# Patient Record
Sex: Male | Born: 1980 | Race: Black or African American | Hispanic: No | Marital: Married | Smoking: Never smoker
Health system: Southern US, Community
[De-identification: ages and names within clinical notes are randomized; demographics above are authoritative.]

## PROBLEM LIST (undated history)

## (undated) DIAGNOSIS — I8390 Asymptomatic varicose veins of unspecified lower extremity: Secondary | ICD-10-CM

## (undated) HISTORY — DX: Asymptomatic varicose veins of unspecified lower extremity: I83.90

## (undated) HISTORY — PX: NO PAST SURGERIES: SHX2092

---

## 2018-03-09 DIAGNOSIS — Z0389 Encounter for observation for other suspected diseases and conditions ruled out: Secondary | ICD-10-CM | POA: Diagnosis not present

## 2018-03-09 DIAGNOSIS — Z1388 Encounter for screening for disorder due to exposure to contaminants: Secondary | ICD-10-CM | POA: Diagnosis not present

## 2018-03-09 DIAGNOSIS — Z3009 Encounter for other general counseling and advice on contraception: Secondary | ICD-10-CM | POA: Diagnosis not present

## 2018-03-21 NOTE — Congregational Nurse Program (Unsigned)
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 Recently arrived from refugee [place] and originally from [place]. Able to speak limited English, but feel the need for an interpreter for clarification in [place] or [place]. [place]ett[place] [facility] approximately two weeks ago with family of six children and spouse. Concerned about learning English to qualify for employment to support family. Recently received Medicaid coverage temporary. Requesting evaluation of health due to reoccurring abdominal pain on right side. Pain subsided today after increasing water in diet. Denies any specific illness in past. CBG 129 non/fasting. Blood pressure normal.Refer to [facility] for evaluation and entry exam. Follow up support through nurse office, agency social worker and staff at [facility].  Ferol LuzMarietta Quaneshia Wareing, RN/CN

## 2018-03-22 DIAGNOSIS — Z049 Encounter for examination and observation for unspecified reason: Secondary | ICD-10-CM | POA: Diagnosis not present

## 2018-03-23 DIAGNOSIS — Z0389 Encounter for observation for other suspected diseases and conditions ruled out: Secondary | ICD-10-CM | POA: Diagnosis not present

## 2018-03-23 DIAGNOSIS — Z111 Encounter for screening for respiratory tuberculosis: Secondary | ICD-10-CM | POA: Diagnosis not present

## 2018-03-23 DIAGNOSIS — Z23 Encounter for immunization: Secondary | ICD-10-CM | POA: Diagnosis not present

## 2018-03-23 DIAGNOSIS — Z1388 Encounter for screening for disorder due to exposure to contaminants: Secondary | ICD-10-CM | POA: Diagnosis not present

## 2018-03-23 DIAGNOSIS — Z049 Encounter for examination and observation for unspecified reason: Secondary | ICD-10-CM | POA: Diagnosis not present

## 2018-03-23 DIAGNOSIS — Z3009 Encounter for other general counseling and advice on contraception: Secondary | ICD-10-CM | POA: Diagnosis not present

## 2018-03-23 DIAGNOSIS — Z206 Contact with and (suspected) exposure to human immunodeficiency virus [HIV]: Secondary | ICD-10-CM | POA: Diagnosis not present

## 2018-04-17 NOTE — Congregational Nurse Program (Unsigned)
Initial visit for this [place] man seeking assistance arranging a PCP for family of spouse and four children. [place]rival in [place] through [employer] resettlement agency.  Appointment confirmed with [facility]. Advised to return with Medicaid information for family and names. Ferol Luz, RN/CN

## 2018-04-20 ENCOUNTER — Ambulatory Visit: Payer: Self-pay | Admitting: Family Medicine

## 2018-05-14 ENCOUNTER — Emergency Department (HOSPITAL_COMMUNITY): Payer: Medicaid Other

## 2018-05-14 ENCOUNTER — Emergency Department (HOSPITAL_COMMUNITY)
Admission: EM | Admit: 2018-05-14 | Discharge: 2018-05-14 | Disposition: A | Discharge status: Home / Self Care | Payer: Medicaid Other | Attending: Emergency Medicine | Admitting: Emergency Medicine

## 2018-05-14 ENCOUNTER — Encounter (HOSPITAL_COMMUNITY): Payer: Self-pay

## 2018-05-14 ENCOUNTER — Other Ambulatory Visit: Payer: Self-pay

## 2018-05-14 DIAGNOSIS — R51 Headache: Secondary | ICD-10-CM | POA: Diagnosis not present

## 2018-05-14 DIAGNOSIS — L97829 Non-pressure chronic ulcer of other part of left lower leg with unspecified severity: Secondary | ICD-10-CM | POA: Diagnosis not present

## 2018-05-14 DIAGNOSIS — I8392 Asymptomatic varicose veins of left lower extremity: Secondary | ICD-10-CM | POA: Diagnosis not present

## 2018-05-14 DIAGNOSIS — J111 Influenza due to unidentified influenza virus with other respiratory manifestations: Secondary | ICD-10-CM | POA: Diagnosis not present

## 2018-05-14 DIAGNOSIS — M79662 Pain in left lower leg: Secondary | ICD-10-CM | POA: Insufficient documentation

## 2018-05-14 DIAGNOSIS — R509 Fever, unspecified: Secondary | ICD-10-CM | POA: Diagnosis not present

## 2018-05-14 LAB — COMPREHENSIVE METABOLIC PANEL
ALT: 16 U/L (ref 0–44)
AST: 26 U/L (ref 15–41)
Albumin: 3.8 g/dL (ref 3.5–5.0)
Alkaline Phosphatase: 50 U/L (ref 38–126)
Anion gap: 8 (ref 5–15)
BILIRUBIN TOTAL: 0.5 mg/dL (ref 0.3–1.2)
BUN: 13 mg/dL (ref 6–20)
CALCIUM: 9 mg/dL (ref 8.9–10.3)
CHLORIDE: 104 mmol/L (ref 98–111)
CO2: 25 mmol/L (ref 22–32)
Creatinine, Ser: 0.92 mg/dL (ref 0.61–1.24)
Glucose, Bld: 102 mg/dL — ABNORMAL HIGH (ref 70–99)
Potassium: 3.5 mmol/L (ref 3.5–5.1)
Sodium: 137 mmol/L (ref 135–145)
TOTAL PROTEIN: 7.4 g/dL (ref 6.5–8.1)

## 2018-05-14 LAB — CBC WITH DIFFERENTIAL/PLATELET
Abs Immature Granulocytes: 0.02 10*3/uL (ref 0.00–0.07)
BASOS ABS: 0 10*3/uL (ref 0.0–0.1)
Basophils Relative: 0 %
EOS ABS: 0.5 10*3/uL (ref 0.0–0.5)
EOS PCT: 4 %
HCT: 43.3 % (ref 39.0–52.0)
Hemoglobin: 13.8 g/dL (ref 13.0–17.0)
Immature Granulocytes: 0 %
LYMPHS PCT: 18 %
Lymphs Abs: 1.9 10*3/uL (ref 0.7–4.0)
MCH: 28.4 pg (ref 26.0–34.0)
MCHC: 31.9 g/dL (ref 30.0–36.0)
MCV: 89.1 fL (ref 80.0–100.0)
MONO ABS: 1.5 10*3/uL — AB (ref 0.1–1.0)
Monocytes Relative: 14 %
NRBC: 0 % (ref 0.0–0.2)
Neutro Abs: 6.6 10*3/uL (ref 1.7–7.7)
Neutrophils Relative %: 64 %
Platelets: 161 10*3/uL (ref 150–400)
RBC: 4.86 MIL/uL (ref 4.22–5.81)
RDW: 12.5 % (ref 11.5–15.5)
WBC: 10.5 10*3/uL (ref 4.0–10.5)

## 2018-05-14 LAB — URINALYSIS, ROUTINE W REFLEX MICROSCOPIC
BILIRUBIN URINE: NEGATIVE
Glucose, UA: NEGATIVE mg/dL
Hgb urine dipstick: NEGATIVE
KETONES UR: NEGATIVE mg/dL
Leukocytes, UA: NEGATIVE
NITRITE: NEGATIVE
Protein, ur: NEGATIVE mg/dL
Specific Gravity, Urine: 1.013 (ref 1.005–1.030)
pH: 8 (ref 5.0–8.0)

## 2018-05-14 LAB — INFLUENZA PANEL BY PCR (TYPE A & B)
INFLBPCR: NEGATIVE
Influenza A By PCR: NEGATIVE

## 2018-05-14 LAB — I-STAT CG4 LACTIC ACID, ED: LACTIC ACID, VENOUS: 1.43 mmol/L (ref 0.5–1.9)

## 2018-05-14 MED ORDER — DIPHENHYDRAMINE HCL 50 MG/ML IJ SOLN
12.5000 mg | Freq: Once | INTRAMUSCULAR | Status: AC
  Administered 2018-05-14: 12.5 mg via INTRAVENOUS
  Filled 2018-05-14: qty 1

## 2018-05-14 MED ORDER — IBUPROFEN 600 MG PO TABS: 600.0000 mg | ORAL_TABLET | Freq: Three times a day (TID) | ORAL | 0 refills | Status: DC | PRN

## 2018-05-14 MED ORDER — SODIUM CHLORIDE 0.9 % IV BOLUS (SEPSIS): 250.0000 mL | Freq: Once | INTRAVENOUS | Status: DC

## 2018-05-14 MED ORDER — OSELTAMIVIR PHOSPHATE 75 MG PO CAPS
75.0000 mg | ORAL_CAPSULE | Freq: Once | ORAL | Status: AC
  Administered 2018-05-14: 75 mg via ORAL
  Filled 2018-05-14: qty 1

## 2018-05-14 MED ORDER — SODIUM CHLORIDE 0.9 % IV BOLUS (SEPSIS)
1000.0000 mL | Freq: Once | INTRAVENOUS | Status: AC
  Administered 2018-05-14 (×2): 1000 mL via INTRAVENOUS

## 2018-05-14 MED ORDER — SODIUM CHLORIDE 0.9 % IV BOLUS (SEPSIS)
1000.0000 mL | Freq: Once | INTRAVENOUS | Status: AC
  Administered 2018-05-14: 1000 mL via INTRAVENOUS

## 2018-05-14 MED ORDER — OSELTAMIVIR PHOSPHATE 75 MG PO CAPS: 75.0000 mg | ORAL_CAPSULE | Freq: Two times a day (BID) | ORAL | 0 refills | Status: DC

## 2018-05-14 MED ORDER — METOCLOPRAMIDE HCL 5 MG/ML IJ SOLN
10.0000 mg | Freq: Once | INTRAMUSCULAR | Status: AC
  Administered 2018-05-14: 10 mg via INTRAVENOUS
  Filled 2018-05-14: qty 2

## 2018-05-14 MED ORDER — ACETAMINOPHEN 325 MG PO TABS
650.0000 mg | ORAL_TABLET | Freq: Once | ORAL | Status: AC
  Administered 2018-05-14: 650 mg via ORAL
  Filled 2018-05-14: qty 2

## 2018-05-14 MED ORDER — IBUPROFEN 800 MG PO TABS
800.0000 mg | ORAL_TABLET | Freq: Once | ORAL | Status: AC
  Administered 2018-05-14: 800 mg via ORAL
  Filled 2018-05-14: qty 1

## 2018-05-14 NOTE — ED Provider Notes (Signed)
[facility] EMERGENCY DEPARTMENT Provider Note   CSN: [id number] Arrival date & time: 05/14/18  2035     History   Chief Complaint Chief Complaint  Patient presents with  . Migraine    HPI William Carroll is a 37 y.o. male otherwise healthy here presenting with fever, chills, headaches, weakness, eye pain.  Patient was a refugee from [place] and came here about 3 months ago.  He has not traveled back since then and has no sick contacts.  Patient states that since yesterday he has been having posterior headaches and associated with some eye pain.  He states that he his eyes tear up a lot but denies any purulent discharge or photophobia or phonophobia.  He states that he has some chills but did not know that he had a fever.  He denies any cough or vomiting or abdominal pain or diarrhea or urinary symptoms.  Patient also mentions to me that he has intermittent swelling of his left lower leg for the last several years.  Apparently several years ago there was some drainage from it and he was thought to have varicose veins.  Denies any recent travel history of blood clots.  Patient denies any sick contacts does have 6 kids at home. In particular, denies any exposure to Ebola.   The history is provided by the patient.  [place] interpreter used   History reviewed. No pertinent past medical history.  There are no active problems to display for this patient.   History reviewed. No pertinent surgical history.      Home Medications    Prior to Admission medications   Not on File    Family History No family history on file.  Social History Social History   Tobacco Use  . Smoking status: Never Smoker  . Smokeless tobacco: Never Used  Substance Use Topics  . Alcohol use: Not Currently  . Drug use: Never     Allergies   Patient has no known allergies.   Review of Systems Review of Systems  Constitutional: Positive for fever.  Neurological: Positive for  headaches.  All other systems reviewed and are negative.    Physical Exam Updated Vital Signs BP 123/75   Pulse 84   Temp (!) 102.7 F (39.3 C) (Oral)   Resp 20   Wt 72 kg   SpO2 98%   BMI 24.62 kg/m   Physical Exam  Constitutional: He is oriented to person, place, and time. He appears well-developed and well-nourished.  HENT:  Head: Normocephalic.  Right Ear: External ear normal.  Left Ear: External ear normal.  Mouth/Throat: Oropharynx is clear and moist.  Eyes: Pupils are equal, round, and reactive to light. Conjunctivae and EOM are normal.  Neck: Normal range of motion. Neck supple.  No meningeal signs, no midline tenderness   Cardiovascular: Normal rate, regular rhythm and normal heart sounds.  Pulmonary/Chest: Effort normal and breath sounds normal. No stridor. No respiratory distress.  Abdominal: Soft. Bowel sounds are normal. He exhibits no distension. There is no tenderness.  Musculoskeletal: Normal range of motion.  L calf with possible varicose veins, no calf tenderness. On the lateral aspect, there is a small cyst with no obvious abscess or cellulitis (see pictures below)  Neurological: He is alert and oriented to person, place, and time. No cranial nerve deficit. Coordination normal.  CN 2- 12 intact, nl strength throughout. Nl gait   Skin: Skin is warm.  Nursing note and vitals reviewed.  ED Treatments / Results  Labs (all labs ordered are listed, but only abnormal results are displayed) Labs Reviewed  COMPREHENSIVE METABOLIC PANEL - Abnormal; Notable for the following components:      Result Value   Glucose, Bld 102 (*)    All other components within normal limits  CBC WITH DIFFERENTIAL/PLATELET - Abnormal; Notable for the following components:   Monocytes Absolute 1.5 (*)    All other components within normal limits  URINALYSIS, ROUTINE W REFLEX MICROSCOPIC - Abnormal; Notable for the following components:   Color, Urine STRAW (*)    All  other components within normal limits  CULTURE, BLOOD (ROUTINE X 2)  CULTURE, BLOOD (ROUTINE X 2)  URINE CULTURE  INFLUENZA PANEL BY PCR (TYPE A & B)  I-STAT CG4 LACTIC ACID, ED    EKG EKG Interpretation  Date/Time:  Sunday May 14 2018 21:16:02 EST Ventricular Rate:  95 PR Interval:    QRS Duration: 73 QT Interval:  309 QTC Calculation: 389 R Axis:   83 Text Interpretation:  Sinus rhythm Consider left ventricular hypertrophy No previous ECGs available Confirmed by Richardean Canal [phone]) on 05/14/2018 9:41:34 PM   Radiology Dg Chest 2 View  Result Date: 05/14/2018 CLINICAL DATA:  Headache, generalized body aches today, fever. EXAM: CHEST - 2 VIEW COMPARISON:  None. FINDINGS: Cardiomediastinal silhouette is within normal limits in size and configuration. Lungs are clear. Lung volumes are normal. No evidence of pneumonia. No pleural effusion. No pneumothorax seen. Osseous structures about the chest are unremarkable. IMPRESSION: No active cardiopulmonary disease. No evidence of pneumonia. Electronically Signed   By: Bary Richard M.D.   On: 05/14/2018 22:07   Dg Tibia/fibula Left  Result Date: 05/14/2018 CLINICAL DATA:  LEFT lower leg pain, lower extremity ulcers. EXAM: LEFT TIBIA AND FIBULA - 2 VIEW COMPARISON:  None. FINDINGS: Osseous alignment is normal. Bone mineralization is normal. No acute or suspicious osseous lesion. Prominent varicosities within the superficial soft tissues of the calf. No evidence of soft tissue gas or fluid collection. IMPRESSION: 1. No osseous abnormality. 2. Prominent varicosities within the superficial soft tissues of the calf. No soft tissue gas seen. Electronically Signed   By: Bary Richard M.D.   On: 05/14/2018 22:09   Ct Head Wo Contrast  Result Date: 05/14/2018 CLINICAL DATA:  Acute onset of headache and eye pain. EXAM: CT HEAD WITHOUT CONTRAST TECHNIQUE: Contiguous axial images were obtained from the base of the skull through the vertex without  intravenous contrast. COMPARISON:  None. FINDINGS: Brain: No evidence of acute infarction, hemorrhage, hydrocephalus, extra-axial collection or mass lesion/mass effect. The posterior fossa, including the cerebellum, brainstem and fourth ventricle, is within normal limits. The third and lateral ventricles, and basal ganglia are unremarkable in appearance. The cerebral hemispheres are symmetric in appearance, with normal gray-white differentiation. No mass effect or midline shift is seen. Vascular: No hyperdense vessel or unexpected calcification. Skull: No free fluid is identified within the pelvis. The colon is unremarkable in appearance; the appendix is normal in caliber, and contains air. Sinuses/Orbits: The visualized portions of the orbits are within normal limits. There is opacification of the diminutive right maxillary sinus. The remaining paranasal sinuses and mastoid air cells are well-aerated. Other: No significant soft tissue abnormalities are seen. IMPRESSION: 1. No acute intracranial pathology seen on CT. 2. Opacification of the diminutive right maxillary sinus. Electronically Signed   By: Roanna Raider M.D.   On: 05/14/2018 21:45    Procedures Procedures (including critical care time)  Medications Ordered in ED Medications  sodium chloride 0.9 % bolus 1,000 mL (1,000 mLs Intravenous New Bag/Given 05/14/18 2223)    And  sodium chloride 0.9 % bolus 1,000 mL (0 mLs Intravenous Stopped 05/14/18 2220)    And  sodium chloride 0.9 % bolus 250 mL (has no administration in time range)  acetaminophen (TYLENOL) tablet 650 mg (650 mg Oral Given 05/14/18 2208)  metoCLOPramide (REGLAN) injection 10 mg (10 mg Intravenous Given 05/14/18 2208)  diphenhydrAMINE (BENADRYL) injection 12.5 mg (12.5 mg Intravenous Given 05/14/18 2208)  oseltamivir (TAMIFLU) capsule 75 mg (75 mg Oral Given 05/14/18 2231)  ibuprofen (ADVIL,MOTRIN) tablet 800 mg (800 mg Oral Given 05/14/18 2247)     Initial Impression /  Assessment and Plan / ED Course  I have reviewed the triage vital signs and the nursing notes.  Pertinent labs & imaging results that were available during my care of the patient were reviewed by me and considered in my medical decision making (see chart for details).    William Carroll is a 37 y.o. male here with headache, fever. He is well appearing, no meningeal signs. Came to the [place] from [place] 3 months ago and had no recent travel or Ebola exposure or sick contacts. Normal neuro exam. I think likely flu syndrome vs viral syndrome with headaches. Consider pneumonia vs UTI as well. Will get labs, CT head, flu, UA, CXR. Will hydrate and reassess.   10:56 PM WBC nl. Lactate nl. UA and CXR nl. CT head unremarkable. Temp down to 101 after tylenol. Headache improved with migraine cocktail. I think likely flu syndrome. Flu sent and his symptoms are less than 24 hrs so given a dose of tamiflu here and will dc home with tamiflu. Gave strict return precautions. Of note, he has varicose veins on the left leg that can be followed up outpatient.    Final Clinical Impressions(s) / ED Diagnoses   Final diagnoses:  None    ED Discharge Orders    None       Charlynne Pander, MD 05/14/18 2257

## 2018-05-14 NOTE — Discharge Instructions (Addendum)
Take motrin 600 mg every 6 hrs for fever.   Take tamiflu as prescribed. You will be called for positive flu results.   See your doctor.   You have varicose veins and your doctor can refer you to a specialist   Return to ER if you have fever > 101 for 5 days, worse headaches, vomiting, trouble breathing.

## 2018-05-14 NOTE — ED Triage Notes (Signed)
Used  [place] interpreter,Per pt has headache and pain in eyes. Headache gone on since last night. Denies N&V.

## 2018-05-16 LAB — URINE CULTURE: Culture: NO GROWTH

## 2018-05-19 LAB — CULTURE, BLOOD (ROUTINE X 2)
CULTURE: NO GROWTH
Culture: NO GROWTH
Special Requests: ADEQUATE
Special Requests: ADEQUATE

## 2018-05-24 ENCOUNTER — Encounter: Payer: Self-pay | Admitting: Family Medicine

## 2018-05-24 ENCOUNTER — Ambulatory Visit: Payer: Medicaid Other | Attending: Family Medicine | Admitting: Family Medicine

## 2018-05-24 VITALS — BP 116/77 | HR 74 | Temp 98.3°F | Resp 16 | Ht 69.5 in | Wt 165.6 lb

## 2018-05-24 DIAGNOSIS — Z09 Encounter for follow-up examination after completed treatment for conditions other than malignant neoplasm: Secondary | ICD-10-CM | POA: Diagnosis not present

## 2018-05-24 DIAGNOSIS — M7989 Other specified soft tissue disorders: Secondary | ICD-10-CM | POA: Diagnosis present

## 2018-05-24 DIAGNOSIS — I83812 Varicose veins of left lower extremities with pain: Secondary | ICD-10-CM | POA: Diagnosis not present

## 2018-05-24 DIAGNOSIS — I8392 Asymptomatic varicose veins of left lower extremity: Secondary | ICD-10-CM | POA: Insufficient documentation

## 2018-05-24 NOTE — Progress Notes (Signed)
Subjective:    Patient ID: William Carroll, male    DOB: Dec 18, 1980, 37 y.o.   MRN: [id number]   Due to a language barrier, patient was accompanied by live interpreter at today's visit.  Patient prefers communication and [place] or [place]ili but does speak some [place].  HPI       37 year old male new to the practice.  Patient was seen in the emergency department on 05/14/2018 secondary to complaint of fever, headaches, weakness and eye pain.  ED believes that patient with possible flu syndrome and patient was given dose of Tamiflu and prescription for Tamiflu.  Patient was also noted to have varicose veins which may require outpatient follow-up.  On review of labs from the ED, patient was negative for influenza test and blood cultures were negative.  Urine culture was done which showed no growth.      At today's visit, patient reports that he feels much better.  Patient has no further headache, sore throat or fever.  Patient feels back to his baseline.  Patient states that his only problem continues to be pain and swelling in an area on the posterior left calf.  Patient denies any prior injury to the left lower leg.  Patient states that the pain is a dull, aching sensation that ranges between a 6 and an 8 on a 0-to-10 scale.  Patient occasionally takes over-the-counter pain medication.  Patient does spend a long time standing at his job.      Patient reports no known drug allergies.  Patient denies any significant past medical history.  Patient is currently on no prescription medications.  Patient is married and does not drink or smoke.  Patient has had no past surgeries.  Patient reports family history is negative for hypertension, diabetes, cancer, heart attacks and strokes.  Patient reports that there have been no significant illnesses in his family.        Review of Systems  Constitutional: Negative for chills, diaphoresis, fatigue and fever.  HENT: Negative for congestion, sore throat and  trouble swallowing.   Respiratory: Negative for cough and shortness of breath.   Cardiovascular: Negative for chest pain, palpitations and leg swelling.       Patient has some recurrent swelling of varicose veins in the back of the left calf  Gastrointestinal: Negative for abdominal pain and nausea.  Endocrine: Negative for polydipsia, polyphagia and polyuria.  Genitourinary: Negative for dysuria and frequency.  Musculoskeletal: Negative for arthralgias, back pain, gait problem and joint swelling.  Neurological: Negative for dizziness and headaches.       Objective:   Physical Exam BP 116/77   Pulse 74   Temp 98.3 F (36.8 C) (Oral)   Resp 16   Ht 5' 9.5" (1.765 m)   Wt 165 lb 9.6 oz (75.1 kg)   SpO2 97%   BMI 24.10 kg/m Nurse's notes and vital signs reviewed General- well-nourished well-developed adult male in no acute distress.  Patient is accompanied by an interpreter at today's visit. ENT- TMs normal, patient with mild edema of the nasal turbinates, no nasal discharge, patient with mild posterior pharynx erythema. Neck-supple, no lymphadenopathy, no thyromegaly, no carotid bruit Lungs-clear to auscultation bilaterally Cardiovascular-regular rate and rhythm Abdomen-soft, nontender Back-no CVA tenderness Extremities- no edema with exception of some swollen and slightly tender varicose veins on the back of the left upper calf        Assessment & Plan:  1. Varicose veins of left lower extremity with pain Patient with  complaint of long-standing issues with discomfort and swelling in the left lower extremity due to a collection of varicose veins.  Patient will be referred to a vein and vascular specialist for further evaluation and treatment. -Ambulatory referral to vascular surgery  2.  Emergency department follow-up encounter Patient's notes and labs from his recent emergency department visit were reviewed prior to patient's appointment and results as well as labs were  reviewed with the patient.  Patient denies any current symptoms.  Patient reports that his fever and headache have both resolved and he feels as if he is back to his baseline state of health.  *Patient was offered but declines influenza immunization at today's visit  An After Visit Summary was printed and given to the patient.  Return if symptoms worsen or fail to improve, for follow-up as needed.

## 2018-07-14 ENCOUNTER — Other Ambulatory Visit: Payer: Self-pay | Admitting: Vascular Surgery

## 2018-07-14 DIAGNOSIS — I83812 Varicose veins of left lower extremities with pain: Secondary | ICD-10-CM

## 2018-07-31 DIAGNOSIS — Z23 Encounter for immunization: Secondary | ICD-10-CM | POA: Diagnosis not present

## 2018-08-16 ENCOUNTER — Encounter: Payer: Medicaid Other | Admitting: Vascular Surgery

## 2018-08-16 ENCOUNTER — Encounter (HOSPITAL_COMMUNITY): Payer: Medicaid Other

## 2018-08-24 ENCOUNTER — Inpatient Hospital Stay (HOSPITAL_COMMUNITY): Admission: RE | Admit: 2018-08-24 | Payer: Medicaid Other | Source: Ambulatory Visit

## 2018-08-24 ENCOUNTER — Encounter: Payer: Medicaid Other | Admitting: Vascular Surgery

## 2018-08-24 ENCOUNTER — Encounter: Payer: Self-pay | Admitting: Family

## 2018-10-19 ENCOUNTER — Encounter (HOSPITAL_COMMUNITY): Payer: Medicaid Other

## 2018-10-19 ENCOUNTER — Encounter: Payer: Medicaid Other | Admitting: Vascular Surgery

## 2018-11-17 ENCOUNTER — Ambulatory Visit (HOSPITAL_COMMUNITY)
Admission: RE | Admit: 2018-11-17 | Discharge: 2018-11-17 | Disposition: A | Discharge status: Home / Self Care | Payer: Medicaid Other | Source: Ambulatory Visit | Attending: Vascular Surgery | Admitting: Vascular Surgery

## 2018-11-17 ENCOUNTER — Other Ambulatory Visit: Payer: Self-pay

## 2018-11-17 DIAGNOSIS — I83812 Varicose veins of left lower extremities with pain: Secondary | ICD-10-CM | POA: Insufficient documentation

## 2018-11-20 ENCOUNTER — Ambulatory Visit (INDEPENDENT_AMBULATORY_CARE_PROVIDER_SITE_OTHER): Payer: Medicaid Other | Admitting: Surgery

## 2018-11-20 ENCOUNTER — Other Ambulatory Visit: Payer: Self-pay

## 2018-11-20 ENCOUNTER — Encounter: Payer: Self-pay | Admitting: Surgery

## 2018-11-20 VITALS — BP 127/83 | HR 75 | Temp 98.0°F | Resp 20 | Ht 69.5 in | Wt 162.0 lb

## 2018-11-20 DIAGNOSIS — I83812 Varicose veins of left lower extremities with pain: Secondary | ICD-10-CM | POA: Diagnosis not present

## 2018-11-20 NOTE — Progress Notes (Signed)
[facility]  Patient name: William Carroll MRN: [id number] DOB: 11/23/1980 Sex: male   REQUESTING PROVIDER:    "family doctor"    REASON FOR CONSULT:    Leg pain and swelling  HISTORY OF PRESENT ILLNESS:   William Carroll is a 38 y.o. male, who has recently moved to the [place].  He speaks Swah[place].  Language interpretation provided by [facility] interpreter.  The patient tells me that he has had leg pain for a long time.  He denies any history of DVT.  He does have some leg swelling.  He has not worn compression socks.  He describes the pain is constant and  PAST MEDICAL HISTORY    Past Medical History:  Diagnosis Date  . Varicose veins of lower extremity      FAMILY HISTORY   Family History  Problem Relation Age of Onset  . Diabetes Neg Hx   . Hypertension Neg Hx   . CAD Neg Hx   . CVA Neg Hx   . Cancer Neg Hx     SOCIAL HISTORY:   Social History   Socioeconomic History  . Marital status: Married    Spouse name: Not on file  . Number of children: Not on file  . Years of education: Not on file  . Highest education level: Not on file  Occupational History  . Not on file  Social Needs  . Financial resource strain: Not on file  . Food insecurity:    Worry: Not on file    Inability: Not on file  . Transportation needs:    Medical: Not on file    Non-medical: Not on file  Tobacco Use  . Smoking status: Never Smoker  . Smokeless tobacco: Never Used  Substance and Sexual Activity  . Alcohol use: Not Currently  . Drug use: Never  . Sexual activity: Yes  Lifestyle  . Physical activity:    Days per week: Not on file    Minutes per session: Not on file  . Stress: Not on file  Relationships  . Social connections:    Talks on phone: Not on file    Gets together: Not on file    Attends religious service: Not on file    Active member of club or organization: Not on file    Attends  meetings of clubs or organizations: Not on file    Relationship status: Not on file  . Intimate partner violence:    Fear of current or ex partner: Not on file    Emotionally abused: Not on file    Physically abused: Not on file    Forced sexual activity: Not on file  Other Topics Concern  . Not on file  Social History Narrative  . Not on file    ALLERGIES:    No Known Allergies  CURRENT MEDICATIONS:    No current outpatient medications on file.   No current facility-administered medications for this visit.     REVIEW OF SYSTEMS:   [X]  denotes positive finding, [ ]  denotes negative finding Cardiac  Comments:  Chest pain or chest pressure:    Shortness of breath upon exertion:    Short of breath when lying flat:    Irregular heart rhythm:        Vascular    Pain in calf, thigh, or hip brought on by ambulation:    Pain in feet at night that wakes you up from your sleep:  Blood clot in your veins:    Leg swelling:  x       Pulmonary    Oxygen at home:    Productive cough:     Wheezing:         Neurologic    Sudden weakness in arms or legs:     Sudden numbness in arms or legs:     Sudden onset of difficulty speaking or slurred speech:    Temporary loss of vision in one eye:     Problems with dizziness:         Gastrointestinal    Blood in stool:      Vomited blood:         Genitourinary    Burning when urinating:     Blood in urine:        Psychiatric    Major depression:         Hematologic    Bleeding problems:    Problems with blood clotting too easily:        Skin    Rashes or ulcers:        Constitutional    Fever or chills:     PHYSICAL EXAM:   Vitals:   11/20/18 1029  BP: 127/83  Pulse: 75  Resp: 20  Temp: 98 F (36.7 C)  SpO2: 93%  Weight: 73.5 kg  Height: 5' 9.5" (1.765 m)    GENERAL: The patient is a well-nourished male, in no acute distress. The vital signs are documented above. CARDIAC: There is a regular rate and  rhythm.  VASCULAR: large prominent varicosity on the left posterior calf.  Trace edema PULMONARY: Nonlabored respirations ABDOMEN: Soft and non-tender with normal pitched bowel sounds.  MUSCULOSKELETAL: There are no major deformities or cyanosis. NEUROLOGIC: No focal weakness or paresthesias are detected. SKIN: There are no ulcers or rashes noted. PSYCHIATRIC: The patient has a normal affect.  STUDIES:   I have reviewed the following duplex: Left: Abnormal reflux times were noted in the common femoral vein, popliteal vein, origin of the small saphenous vein, prox small saphenous vein, and mid small saphenous vein. There is no evidence of deep vein thrombosis in the lower extremity.There is  no evidence of superficial venous thrombosis. VEIN DIAMETERS:               Right (cm)Left (cm) +------------------------------+----------+---------+ GSV at Saphenofemoral junction          0.738     +------------------------------+----------+---------+ GSV at prox thigh                       0.392     +------------------------------+----------+---------+ GSV at mid thigh                        0.374     +------------------------------+----------+---------+ GSV at distal thigh                     0.366     +------------------------------+----------+---------+ GSV at knee                             0.301     +------------------------------+----------+---------+ GSV prox calf                           0.269     +------------------------------+----------+---------+ SSV origin  0.872     +------------------------------+----------+---------+ SSV prox                                0.758     +------------------------------+----------+---------+ SSV mid                                 0.72      +------------------------------+----------+---------+  ASSESSMENT and PLAN   Painful varicosity with edema to the left calf (CEAP class 3):   I discussed with the patient that the first step in treating this is to keep his leg elevated.  I provided him with a 20-30 compression stocking which I told him to wear.  He will follow up in 3 months for repeat evaluation.  He may be a candidate for stab phlebecotmy and laser ablation of the SSV   Durene Cal, IV, MD, FACS [facility] [phone] Pager [phone]

## 2019-02-21 ENCOUNTER — Encounter: Payer: Self-pay | Admitting: Vascular Surgery

## 2019-02-21 ENCOUNTER — Other Ambulatory Visit: Payer: Self-pay

## 2019-02-21 ENCOUNTER — Ambulatory Visit (INDEPENDENT_AMBULATORY_CARE_PROVIDER_SITE_OTHER): Payer: Medicaid Other | Admitting: Vascular Surgery

## 2019-02-21 VITALS — BP 114/78 | HR 76 | Temp 97.5°F | Resp 16 | Ht 68.5 in | Wt 162.2 lb

## 2019-02-21 DIAGNOSIS — I83812 Varicose veins of left lower extremities with pain: Secondary | ICD-10-CM

## 2019-02-21 NOTE — Progress Notes (Signed)
Patient is a 38 year old male previously seen by my partner Dr. Trula Slade in May 2020.  At that time he was complaining of left leg pain that has been going on for several years.  He had no prior history of DVT.  He does occasionally have swelling in the left leg.  He was given a prescription for compression stockings at his last office visit.  Review of his duplex ultrasound from his previous office visit shows a dilated lesser saphenous vein of 7 to 8 mm with diffuse reflux.  No significant reflux in the greater saphenous with a vein diameter of about 3 to 3-1/2 mm.  Review today was conducted through a [employer] provided interpreter.  He has been compliant wearing his compression stockings.  He states that the veins do go away when he wears the compression stockings but then come back.  He has had only mild symptomatic relief and still complains of pain and swelling.  Review of systems: He has no shortness of breath.  He has no chest pain.  Physical exam:  Vitals:   02/21/19 1335  BP: 114/78  Pulse: 76  Resp: 16  Temp: (!) 97.5 F (36.4 C)  TempSrc: Temporal  SpO2: 99%  Weight: 162 lb 3.2 oz (73.6 kg)  Height: 5' 8.5" (1.74 m)    Left lower extremity, large cluster of varicosities in the posterior calf 7 to 8 mm diameter.  Data: I reviewed the patient's recent ultrasound which showed a 7 to 8 mm lesser saphenous vein with diffuse reflux.  He had a normal greater saphenous vein.  I repeated portions of his ultrasound today with the SonoSite which again confirmed the above findings.  Assessment: Symptomatic varicose veins with pain and swelling left leg.  Plan: Stab avulsions with laser ablation of the left greater saphenous vein in the near future pending insurance approval.  Ruta Hinds, MD [facility] [place]: [phone] Pager: [phone]

## 2019-02-23 ENCOUNTER — Other Ambulatory Visit: Payer: Self-pay | Admitting: *Deleted

## 2019-02-23 DIAGNOSIS — I83812 Varicose veins of left lower extremities with pain: Secondary | ICD-10-CM

## 2019-03-14 ENCOUNTER — Other Ambulatory Visit: Payer: Self-pay

## 2019-03-14 ENCOUNTER — Ambulatory Visit: Payer: Medicaid Other | Admitting: Vascular Surgery

## 2019-03-14 ENCOUNTER — Encounter: Payer: Self-pay | Admitting: Vascular Surgery

## 2019-03-14 VITALS — BP 127/82 | HR 74 | Temp 98.0°F | Resp 16 | Ht 68.5 in | Wt 162.0 lb

## 2019-03-14 DIAGNOSIS — I83812 Varicose veins of left lower extremities with pain: Secondary | ICD-10-CM

## 2019-03-14 HISTORY — PX: ENDOVENOUS ABLATION SAPHENOUS VEIN W/ LASER: SUR449

## 2019-03-14 NOTE — Progress Notes (Signed)
     Laser Ablation Procedure    Date: 03/14/2019   William Carroll DOB:1981/04/04  Consent signed: Yes    Surgeon: Ruta Hinds MD   Procedure: Laser Ablation: left Small Saphenous Vein  BP 127/82 (BP Location: Right Arm, Patient Position: Sitting, Cuff Size: Normal)   Pulse 74   Temp 98 F (36.7 C) (Temporal)   Resp 16   Ht 5' 8.5" (1.74 m)   Wt 162 lb (73.5 kg)   SpO2 98%   BMI 24.27 kg/m   Tumescent Anesthesia: 300 cc 0.9% NaCl with 50 cc Lidocaine HCL 1%  and 15 cc 8.4% NaHCO3  Local Anesthesia: 4 cc Lidocaine HCL and NaHCO3 (ratio 2:1)  15 watts continuous mode        Total energy: 756 Joules   Total time: 0:50  Laser Fiber Ref. # [id number]      Lot # [id number]   Stab Phlebectomy: 10-20 Sites: Calf and Ankle  Patient tolerated procedure well  Notes: Patient wore face mask.  All staff members wore facial masks and facial shields/goggles.  Interpreter present in room before, during, and after procedure to translate.    Description of Procedure:  After marking the course of the secondary varicosities, the patient was placed on the operating table in the prone position, and the left leg was prepped and draped in sterile fashion.   Local anesthetic was administered and under ultrasound guidance the saphenous vein was accessed with a micro needle and guide wire; then the mirco puncture sheath was placed.  A guide wire was inserted saphenopopliteal junction , followed by a 5 french sheath.  The position of the sheath and then the laser fiber below the junction was confirmed using the ultrasound.  Tumescent anesthesia was administered along the course of the saphenous vein using ultrasound guidance. The patient was placed in Trendelenburg position and protective laser glasses were placed on patient and staff, and the laser was fired at 15 watts continuous mode advancing 1-29mm/second for a total of 756 joules.   For stab phlebectomies, local anesthetic was administered at the  previously marked varicosities, and tumescent anesthesia was administered around the vessels.  Ten to 20 stab wounds were made using the tip of an 11 blade. And using the vein hook, the phlebectomies were performed using a hemostat to avulse the varicosities.  Adequate hemostasis was achieved.     Steri strips were applied to the stab wounds and ABD pads and thigh high compression stockings were applied.  Ace wrap bandages were applied over the phlebectomy sites and at the top of the saphenopopliteal junction. Blood loss was less than 15 cc.  Discharge instructions reviewed with patient and hardcopy of discharge instructions given to patient to take home. The patient ambulated out of the operating room having tolerated the procedure well.  Ruta Hinds, MD [facility] Office: [phone] Pager: [phone]

## 2019-03-15 ENCOUNTER — Telehealth: Payer: Self-pay | Admitting: *Deleted

## 2019-03-15 NOTE — Telephone Encounter (Signed)
Through Swahili interpreter, informed Mr. William Carroll that I had completed and FAXED the attending physician statement to [place] as he requested. Informed him that there was a section that he would need to complete and a section that his employer would need to complete that was part of the short term disability packet.  I informed him that he could pick up the original short term disability paperwork in a sealed envelope at the [facility] front desk Monday-Friday between 9 AM-5 PM.

## 2019-03-21 ENCOUNTER — Ambulatory Visit (INDEPENDENT_AMBULATORY_CARE_PROVIDER_SITE_OTHER): Payer: Self-pay | Admitting: Vascular Surgery

## 2019-03-21 ENCOUNTER — Ambulatory Visit (HOSPITAL_COMMUNITY)
Admission: RE | Admit: 2019-03-21 | Discharge: 2019-03-21 | Disposition: A | Discharge status: Home / Self Care | Payer: Medicaid Other | Source: Ambulatory Visit | Attending: Vascular Surgery | Admitting: Vascular Surgery

## 2019-03-21 ENCOUNTER — Other Ambulatory Visit: Payer: Self-pay

## 2019-03-21 ENCOUNTER — Encounter: Payer: Self-pay | Admitting: Vascular Surgery

## 2019-03-21 VITALS — BP 123/80 | HR 72 | Temp 97.1°F | Resp 16 | Ht 68.5 in | Wt 162.0 lb

## 2019-03-21 DIAGNOSIS — I83812 Varicose veins of left lower extremities with pain: Secondary | ICD-10-CM | POA: Insufficient documentation

## 2019-03-21 NOTE — Progress Notes (Signed)
Patient is a 38 year old male who returns for postoperative follow-up today.  He recently underwent laser ablation with stab avulsions of his left lesser saphenous vein.  He reports still some mild soreness.  He has no shortness of breath.  He has no chest pain.  He had no bleeding episodes.  Patient does not speak [place] and a Zacarias Pontes provided interpreter was used for the office visit today.  Review of systems: He has no fever.  He has no cough.  Physical exam:  Vitals:   03/21/19 1047  BP: 123/80  Pulse: 72  Resp: 16  Temp: (!) 97.1 F (36.2 C)  TempSrc: Temporal  SpO2: 99%  Weight: 162 lb (73.5 kg)  Height: 5' 8.5" (1.74 m)    Healing stab avulsion incisions left leg significant improvement of prominent varicosities with possibly only one area of varicose vein left in the posterior calf.  Data: Duplex ultrasound today shows successful ablation of the lesser saphenous vein with no evidence of DVT.  I reviewed and interpreted the study.  Assessment: Doing well status post laser ablation lesser saphenous and stab avulsions.  Plan: Patient will continue to wear his compression stockings for symptomatic relief.  He will call us in the future if he has any recurrence of symptoms.  Otherwise he will follow-up on an as-needed basis.  He will return to work on September 28.  Ruta Hinds, MD [facility] Office: [phone] Pager: [phone]

## 2019-07-25 ENCOUNTER — Encounter: Payer: Self-pay | Admitting: Vascular Surgery

## 2019-07-25 ENCOUNTER — Other Ambulatory Visit: Payer: Self-pay

## 2019-07-25 ENCOUNTER — Ambulatory Visit (INDEPENDENT_AMBULATORY_CARE_PROVIDER_SITE_OTHER): Payer: Medicaid Other | Admitting: Vascular Surgery

## 2019-07-25 ENCOUNTER — Ambulatory Visit: Payer: Medicaid Other | Admitting: Vascular Surgery

## 2019-07-25 VITALS — BP 125/67 | HR 78 | Temp 97.8°F | Resp 20 | Ht 68.5 in | Wt 162.0 lb

## 2019-07-25 DIAGNOSIS — Z48812 Encounter for surgical aftercare following surgery on the circulatory system: Secondary | ICD-10-CM

## 2019-07-25 DIAGNOSIS — I83812 Varicose veins of left lower extremities with pain: Secondary | ICD-10-CM

## 2019-07-25 NOTE — Progress Notes (Signed)
Patient name: William Carroll MRN: [id number] DOB: 1980-09-30 Sex: male  REASON FOR VISIT:   Pins-and-needles and severe itching.  The patient apparently was a walk-in.  HPI:   William Carroll is a pleasant 39 y.o. male who was previously been seen by Dr. Fabienne Bruns.  Most recently he was seen on 03/21/2019.  The patient had undergone laser ablation of the left small saphenous vein with stab phlebectomies.  At the time of his last visit his incisions were healing well.  He had improvement in the varicosities.  Duplex scan showed successful ablation of the small saphenous vein with no evidence of DVT.  The history today is obtained through the translator device.  He complains of what sounds like pins-and-needles in the lower half of his left leg where he is undergone a previous ablation.  I do not get any history of claudication or rest pain.  He has not had any significant leg swelling or calf pain.  Does not think the symptoms sound nerve related.  Past Medical History:  Diagnosis Date  . Varicose veins of lower extremity     Family History  Problem Relation Age of Onset  . Diabetes Neg Hx   . Hypertension Neg Hx   . CAD Neg Hx   . CVA Neg Hx   . Cancer Neg Hx     SOCIAL HISTORY: Social History   Tobacco Use  . Smoking status: Never Smoker  . Smokeless tobacco: Never Used  Substance Use Topics  . Alcohol use: Not Currently    No Known Allergies  No current outpatient medications on file.   No current facility-administered medications for this visit.    REVIEW OF SYSTEMS:  [X]  denotes positive finding, [ ]  denotes negative finding Cardiac  Comments:  Chest pain or chest pressure:    Shortness of breath upon exertion:    Short of breath when lying flat:    Irregular heart rhythm:        Vascular    Pain in calf, thigh, or hip brought on by ambulation:    Pain in feet at night that wakes you up from your sleep:     Blood clot in your veins:    Leg swelling:          Pulmonary    Oxygen at home:    Productive cough:     Wheezing:         Neurologic    Sudden weakness in arms or legs:     Sudden numbness in arms or legs:     Sudden onset of difficulty speaking or slurred speech:    Temporary loss of vision in one eye:     Problems with dizziness:         Gastrointestinal    Blood in stool:     Vomited blood:         Genitourinary    Burning when urinating:     Blood in urine:        Psychiatric    Major depression:         Hematologic    Bleeding problems:    Problems with blood clotting too easily:        Skin    Rashes or ulcers:        Constitutional    Fever or chills:     PHYSICAL EXAM:   Vitals:   07/25/19 1123  BP: 125/67  Pulse: 78  Resp: 20  Temp: 97.8 F (  36.6 C)  SpO2: 98%  Weight: 162 lb (73.5 kg)  Height: 5' 8.5" (1.74 m)    GENERAL: The patient is a well-nourished male, in no acute distress. The vital signs are documented above. CARDIAC: There is a regular rate and rhythm.  VASCULAR: He has palpable pedal pulses. He has no significant lower extremity swelling. PULMONARY: There is good air exchange bilaterally without wheezing or rales. ABDOMEN: Soft and non-tender with normal pitched bowel sounds.  MUSCULOSKELETAL: There are no major deformities or cyanosis. NEUROLOGIC: No focal weakness or paresthesias are detected. SKIN: There are no ulcers or rashes noted. PSYCHIATRIC: The patient has a normal affect.  DATA:    No new data  MEDICAL ISSUES:   STATUS POST LASER ABLATION LEFT SMALL SAPHENOUS VEIN: The patient has some paresthesias and pins-and-needles in the distal left leg.  This could possibly be related to irritation of the sural nerve.  I explained that I think the symptoms will improve with time.  He has no significant swelling.  All of his wounds of healed nicely.  He has excellent pulses.  We'll see him back as needed.  Deitra Mayo [facility] [phone]

## 2019-07-27 ENCOUNTER — Encounter: Payer: Self-pay | Admitting: Vascular Surgery

## 2019-10-07 IMAGING — CT CT HEAD W/O CM
3 of 4 series · 13 of 47 positions shown, 15 images · non-contrast
Comparison: None.

CLINICAL DATA: Acute onset of headache and eye pain.

EXAM:
CT HEAD WITHOUT CONTRAST
TECHNIQUE: Contiguous axial images were obtained from the base of the skull
through the vertex without intravenous contrast.

[Series 3: head without · axial · non-contrast · 0.47mm/px · z∈[-67,+53]mm · 7 of 34 slices shown, 9 images]
[im 5/34  brain]
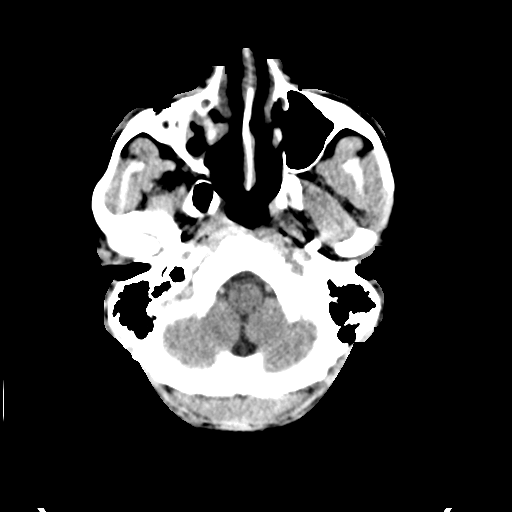
[im 5/34  bone]
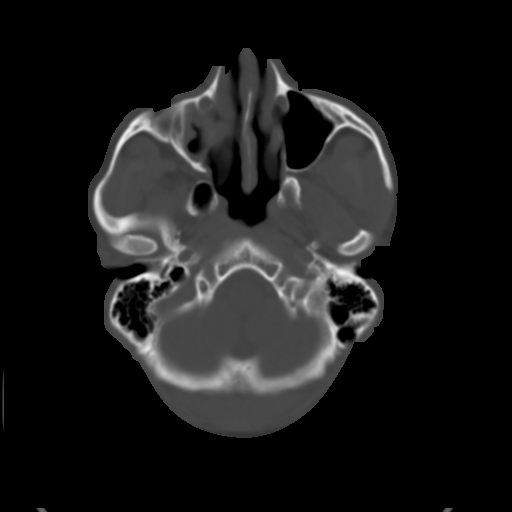
[im 9/34  brain]
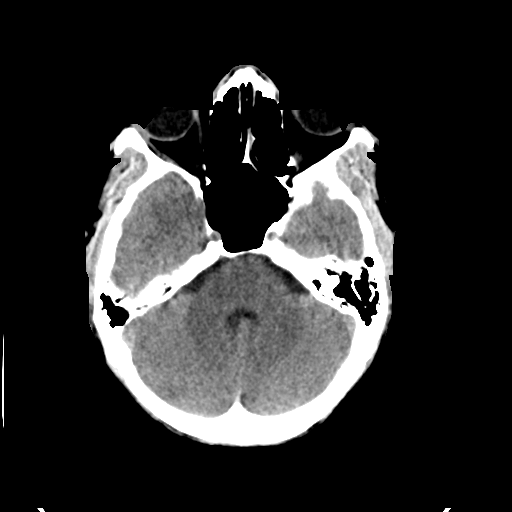
[im 13/34  brain]
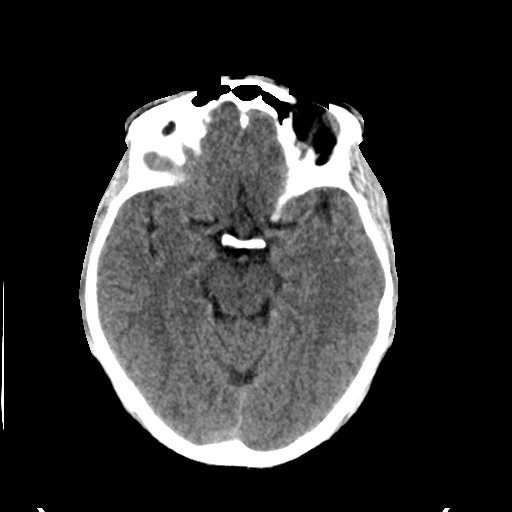
[im 17/34  brain]
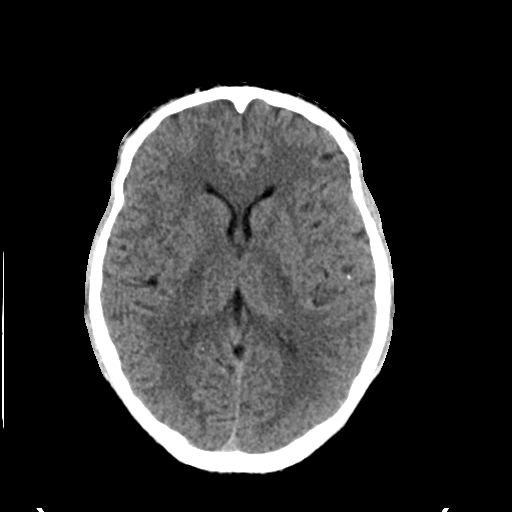
[im 21/34  brain]
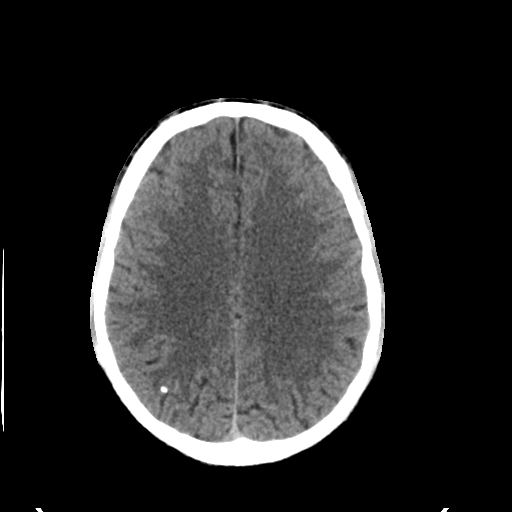
[im 21/34  bone]
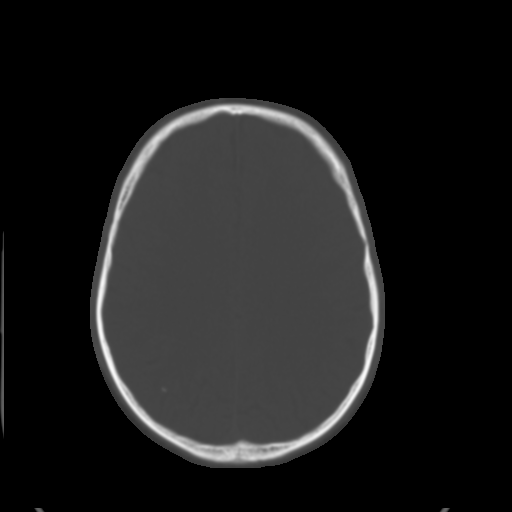
[im 25/34  brain]
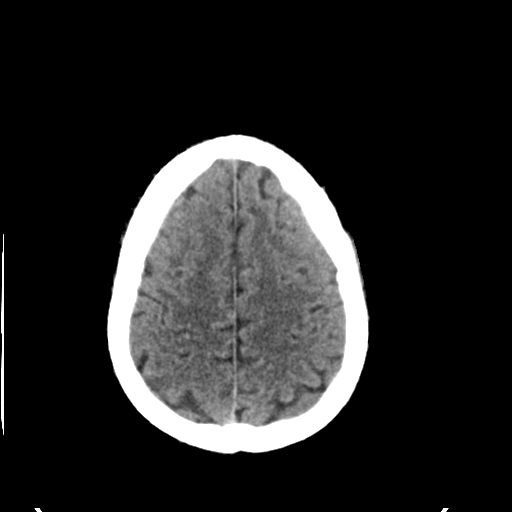
[im 29/34  brain]
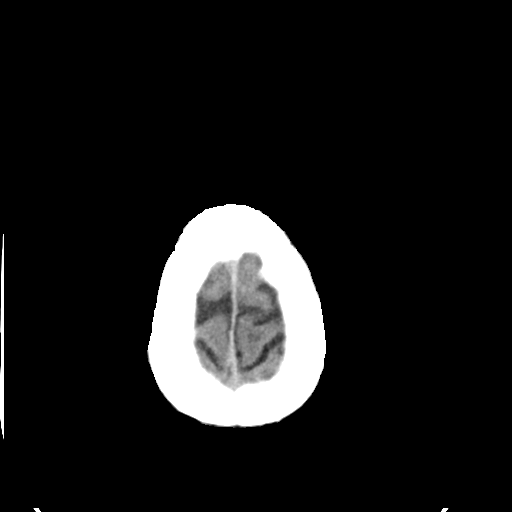

[Series 5: head without cor · coronal · non-contrast · 0.33mm/px · 3 of 76 slices shown]
[im 26/76  brain]
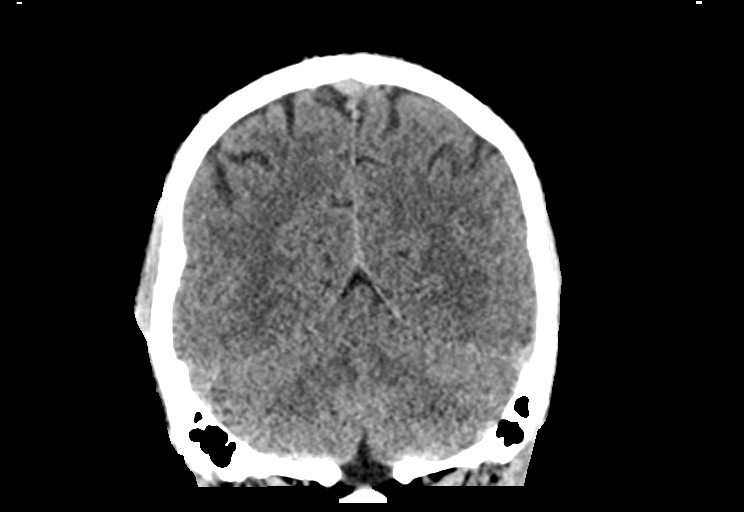
[im 34/76  brain]
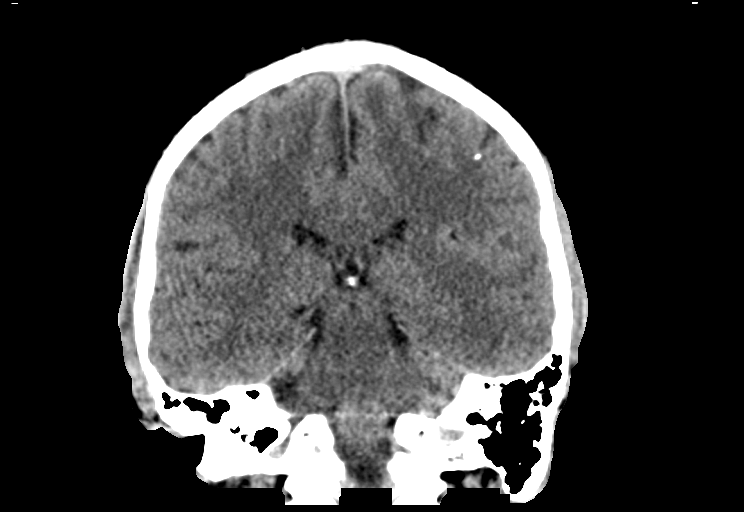
[im 42/76  brain]
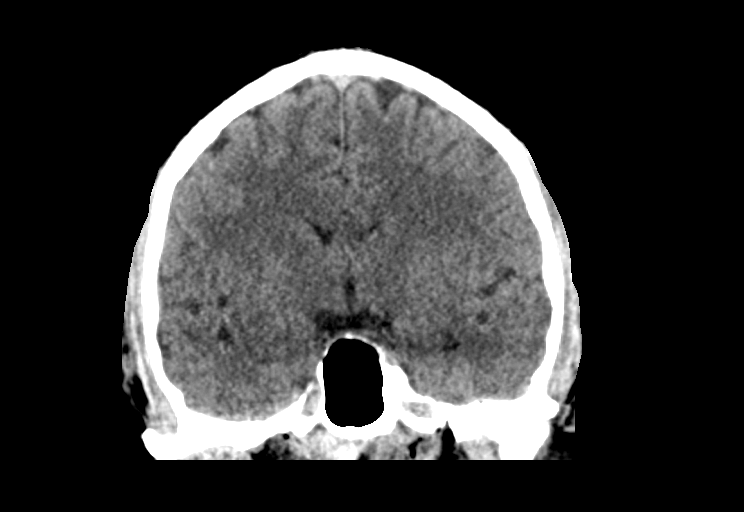

[Series 6: head without sag · sagittal · non-contrast · 0.33mm/px · 3 of 66 slices shown]
[im 22/66  brain]
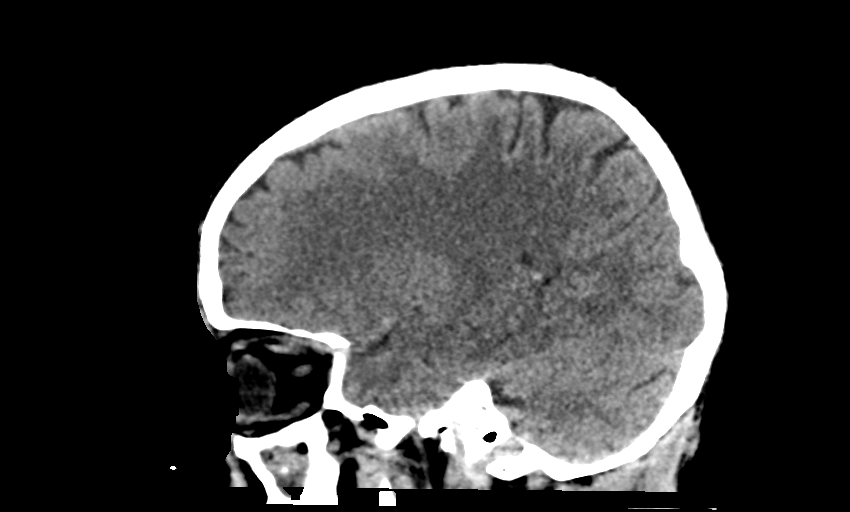
[im 33/66  brain]
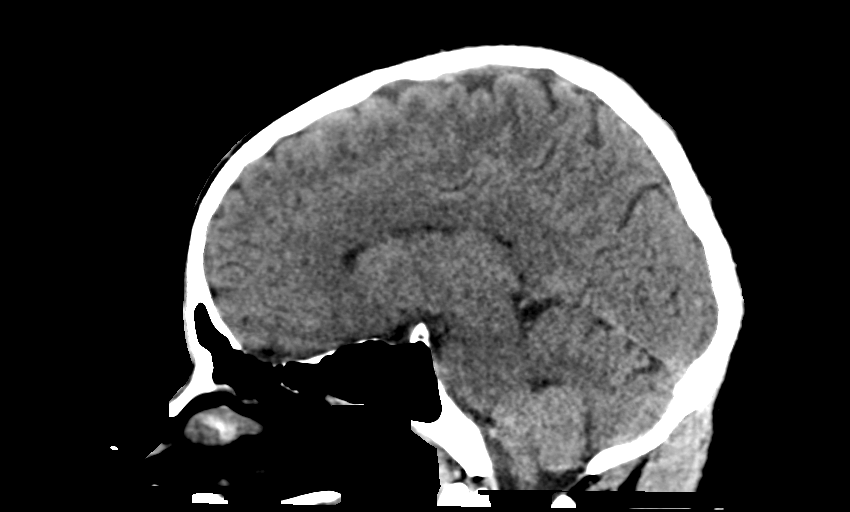
[im 44/66  brain]
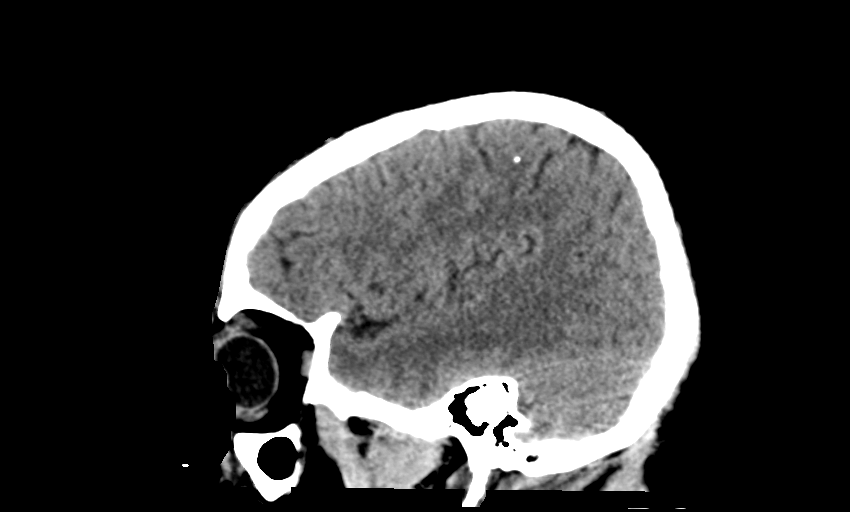

[13 of 47 positions shown; findings below may reference images not displayed]

FINDINGS: Brain: No evidence of acute infarction, hemorrhage, hydrocephalus,
extra-axial collection or mass lesion/mass effect.

The posterior fossa, including the cerebellum, brainstem and fourth
ventricle, is within normal limits. The third and lateral
ventricles, and basal ganglia are unremarkable in appearance. The
cerebral hemispheres are symmetric in appearance, with normal
gray-white differentiation. No mass effect or midline shift is seen.

Vascular: No hyperdense vessel or unexpected calcification.

Skull: No free fluid is identified within the pelvis. The colon is
unremarkable in appearance; the appendix is normal in caliber, and
contains air.

Sinuses/Orbits: The visualized portions of the orbits are within
normal limits. There is opacification of the diminutive right
maxillary sinus. The remaining paranasal sinuses and mastoid air
cells are well-aerated.

Other: No significant soft tissue abnormalities are seen.
IMPRESSION: 1. No acute intracranial pathology seen on CT.
2. Opacification of the diminutive right maxillary sinus.

## 2019-10-07 IMAGING — CR DG CHEST 2V
2 series · 2 of 2 positions shown · non-contrast
Comparison: None.

CLINICAL DATA: Headache, generalized body aches today, fever.

EXAM:
CHEST - 2 VIEW

[chest lat]
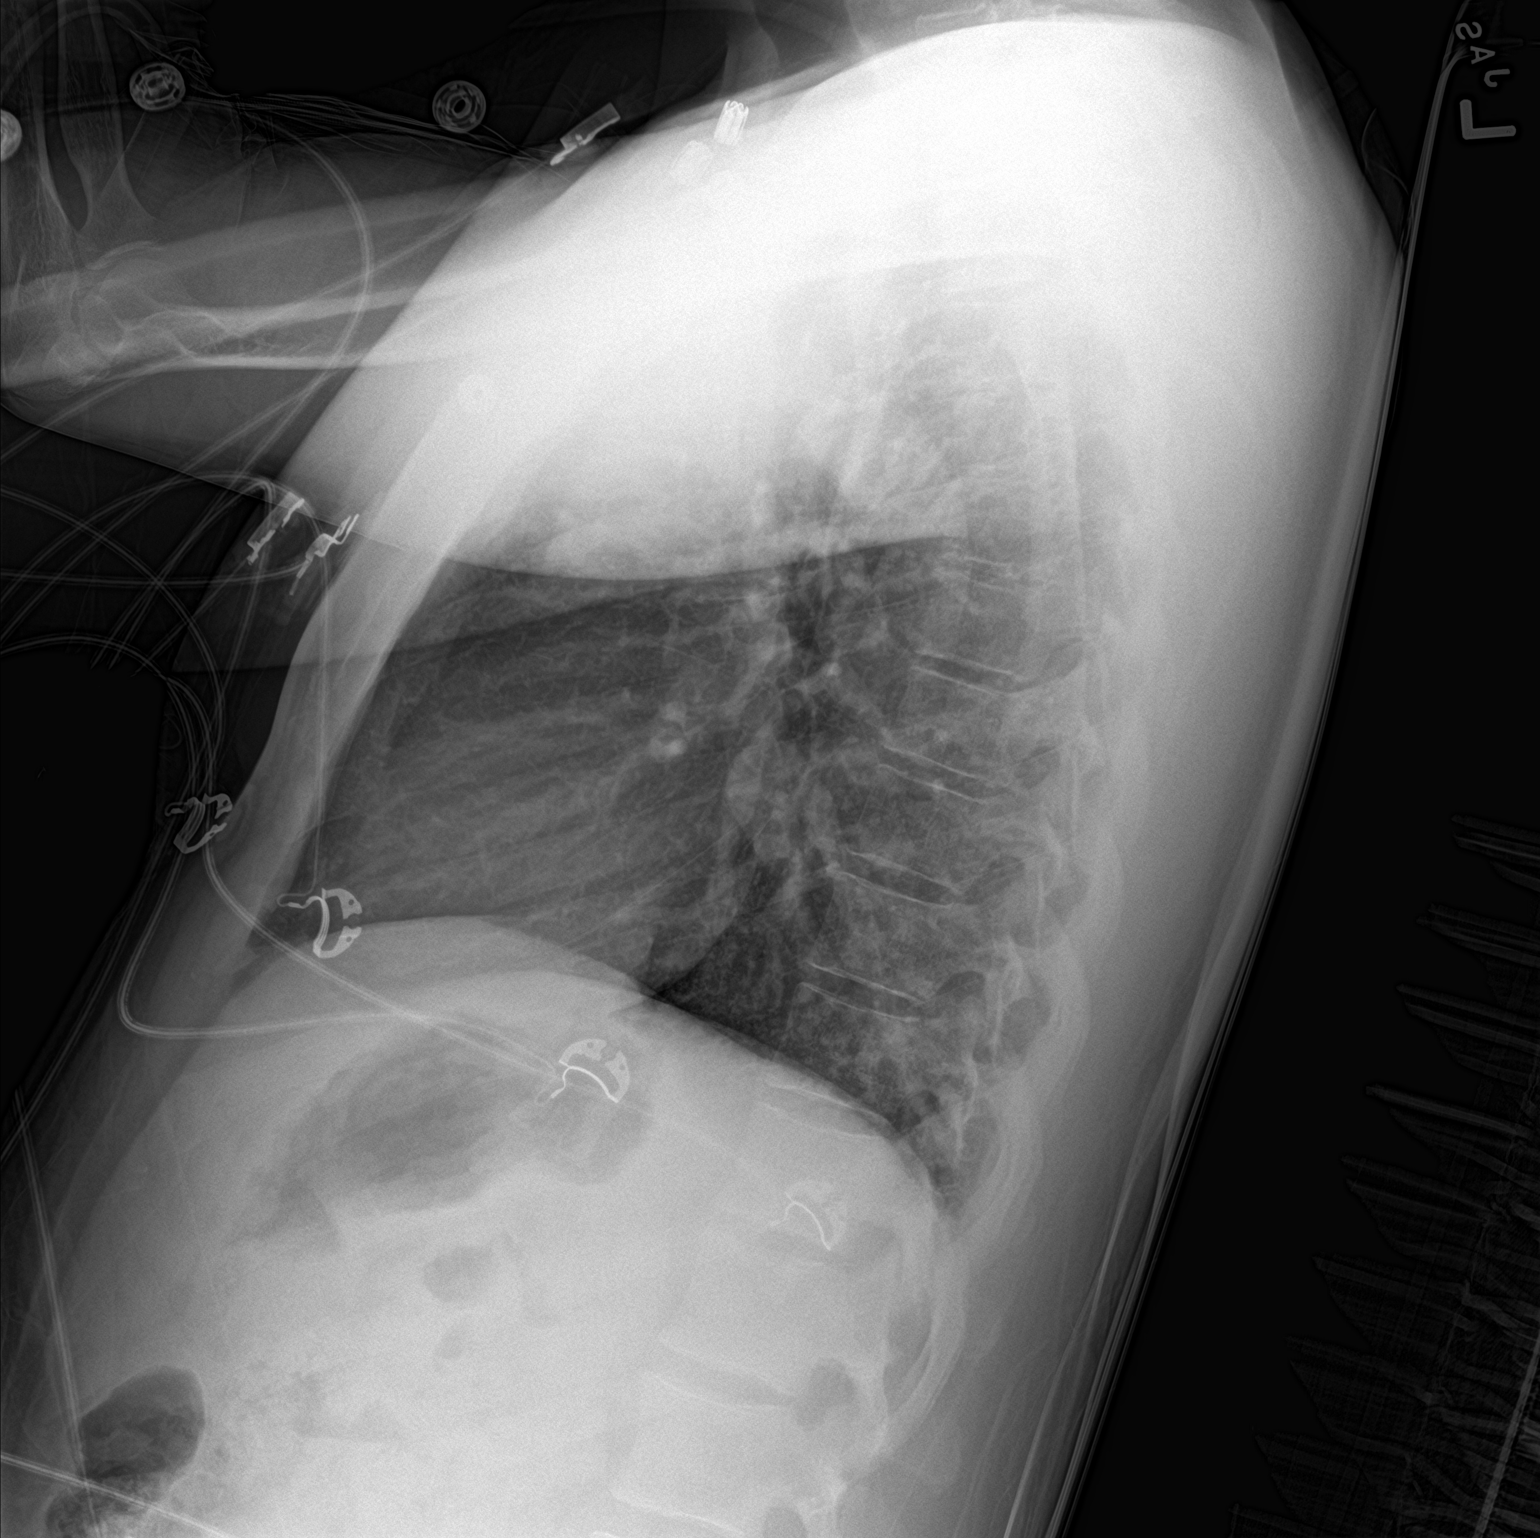

[chest ap]
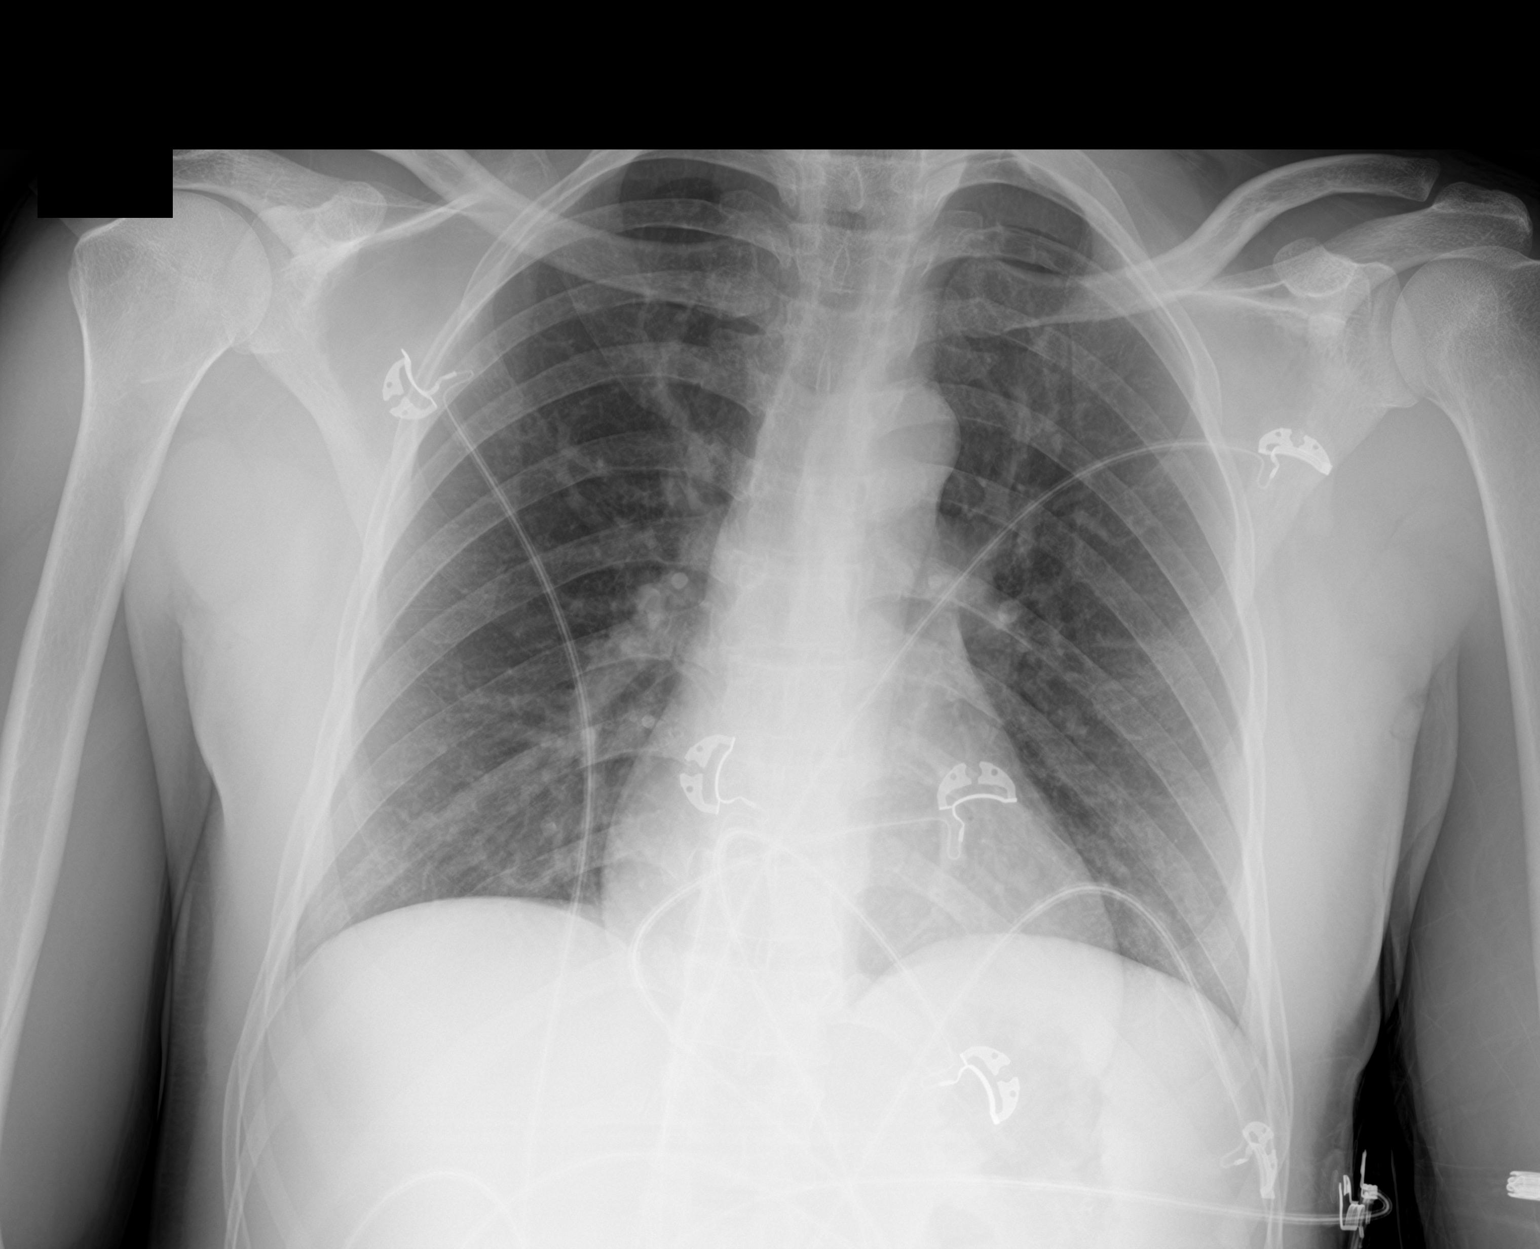

[2 of 2 positions shown; findings below may reference images not displayed]

FINDINGS: Cardiomediastinal silhouette is within normal limits in size and
configuration. Lungs are clear. Lung volumes are normal. No evidence
of pneumonia. No pleural effusion. No pneumothorax seen. Osseous
structures about the chest are unremarkable.
IMPRESSION: No active cardiopulmonary disease. No evidence of pneumonia.

## 2019-12-25 ENCOUNTER — Encounter (HOSPITAL_COMMUNITY): Payer: Self-pay | Admitting: Emergency Medicine

## 2019-12-25 ENCOUNTER — Other Ambulatory Visit: Payer: Self-pay

## 2019-12-25 ENCOUNTER — Ambulatory Visit (HOSPITAL_COMMUNITY)
Admission: EM | Admit: 2019-12-25 | Discharge: 2019-12-25 | Disposition: A | Discharge status: Home / Self Care | Payer: BC Managed Care – PPO | Attending: Physician Assistant | Admitting: Physician Assistant

## 2019-12-25 DIAGNOSIS — M791 Myalgia, unspecified site: Secondary | ICD-10-CM | POA: Diagnosis not present

## 2019-12-25 LAB — CBC WITH DIFFERENTIAL/PLATELET
Abs Immature Granulocytes: 0.01 10*3/uL (ref 0.00–0.07)
Basophils Absolute: 0.1 10*3/uL (ref 0.0–0.1)
Basophils Relative: 1 %
Eosinophils Absolute: 0.3 10*3/uL (ref 0.0–0.5)
Eosinophils Relative: 7 %
HCT: 43.4 % (ref 39.0–52.0)
Hemoglobin: 14.8 g/dL (ref 13.0–17.0)
Immature Granulocytes: 0 %
Lymphocytes Relative: 35 %
Lymphs Abs: 1.5 10*3/uL (ref 0.7–4.0)
MCH: 29.4 pg (ref 26.0–34.0)
MCHC: 34.1 g/dL (ref 30.0–36.0)
MCV: 86.3 fL (ref 80.0–100.0)
Monocytes Absolute: 0.5 10*3/uL (ref 0.1–1.0)
Monocytes Relative: 13 %
Neutro Abs: 1.8 10*3/uL (ref 1.7–7.7)
Neutrophils Relative %: 44 %
Platelets: 162 10*3/uL (ref 150–400)
RBC: 5.03 MIL/uL (ref 4.22–5.81)
RDW: 12 % (ref 11.5–15.5)
WBC: 4.2 10*3/uL (ref 4.0–10.5)
nRBC: 0 % (ref 0.0–0.2)

## 2019-12-25 LAB — BASIC METABOLIC PANEL
Anion gap: 8 (ref 5–15)
BUN: 10 mg/dL (ref 6–20)
CO2: 26 mmol/L (ref 22–32)
Calcium: 9.2 mg/dL (ref 8.9–10.3)
Chloride: 108 mmol/L (ref 98–111)
Creatinine, Ser: 0.79 mg/dL (ref 0.61–1.24)
GFR calc Af Amer: >60 mL/min (ref >60)
GFR calc non Af Amer: >60 mL/min (ref >60)
Glucose, Bld: 117 mg/dL — ABNORMAL HIGH (ref 70–99)
Potassium: 4.2 mmol/L (ref 3.5–5.1)
Sodium: 142 mmol/L (ref 135–145)

## 2019-12-25 MED ORDER — DICLOFENAC SODIUM 75 MG PO TBEC: 75.0000 mg | DELAYED_RELEASE_TABLET | Freq: Two times a day (BID) | ORAL | 0 refills | Status: DC

## 2019-12-25 NOTE — ED Triage Notes (Signed)
Swahili interpreter used for triage.   After 2nd COVID vaccine, he has developed dizziness, headache, fatigue, and bodyaches.   Got vaccine 6/14.   Symptoms were most severe 3 days ago. He was extremely dizzy 3 days ago, he almost fell down. Dizziness has improved since then.   "Eyes feel heavy"

## 2019-12-25 NOTE — ED Notes (Signed)
Advised pt waiting on blood results. Pt verbalized understanding

## 2019-12-25 NOTE — ED Provider Notes (Signed)
[facility]    CSN: [id number] Arrival date & time: 12/25/19  1000      History   Chief Complaint No chief complaint on file.   HPI William Carroll is a 39 y.o. male.   Pt reports he has been tired since 2nd covid shot.  Pt complains of muscle aches. Pt reports he feels weak.  Pt denies any fever or chills,  No cough or sore throat.  Pt has limited past medical care.  Pt reports prior to moving to [place] he was never sick.    The history is provided by the patient. No language interpreter was used.    Past Medical History:  Diagnosis Date   Varicose veins of lower extremity     Patient Active Problem List   Diagnosis Date Noted   Varicose veins of left lower extremity 05/24/2018    Past Surgical History:  Procedure Laterality Date   ENDOVENOUS ABLATION SAPHENOUS VEIN W/ LASER Left 03/14/2019   endovenous laser ablation left small saphenous vein and stab phlebectomy 10-20 incisions left leg by Ruta Hinds MD    NO PAST SURGERIES         Home Medications    Prior to Admission medications   Medication Sig Start Date End Date Taking? Authorizing Provider  diclofenac (VOLTAREN) 75 MG EC tablet Take 1 tablet (75 mg total) by mouth 2 (two) times daily. 12/25/19   Fransico Meadow, PA-C    Family History Family History  Problem Relation Age of Onset   Diabetes Neg Hx    Hypertension Neg Hx    CAD Neg Hx    CVA Neg Hx    Cancer Neg Hx     Social History Social History   Tobacco Use   Smoking status: Never Smoker   Smokeless tobacco: Never Used  Scientific laboratory technician Use: Never used  Substance Use Topics   Alcohol use: Not Currently   Drug use: Never     Allergies   Patient has no known allergies.   Review of Systems Review of Systems  Neurological: Positive for weakness and light-headedness.  All other systems reviewed and are negative.    Physical Exam Triage Vital Signs ED Triage Vitals  Enc Vitals Group     BP  12/25/19 1044 112/78     Pulse Rate 12/25/19 1044 71     Resp 12/25/19 1043 16     Temp 12/25/19 1043 97.9 F (36.6 C)     Temp Source 12/25/19 1043 Oral     SpO2 12/25/19 1044 96 %     Weight --      Height --      Head Circumference --      Peak Flow --      Pain Score 12/25/19 1041 6     Pain Loc --      Pain Edu? --      Excl. in [place]? --    No data found.  Updated Vital Signs BP 112/78    Pulse 71    Temp 97.6 F (36.4 C) (Oral)    Resp 16    SpO2 96%   Visual Acuity Right Eye Distance:   Left Eye Distance:   Bilateral Distance:    Right Eye Near:   Left Eye Near:    Bilateral Near:     Physical Exam Vitals and nursing note reviewed.  Constitutional:      Appearance: He is well-developed.  HENT:  Head: Normocephalic and atraumatic.     Mouth/Throat:     Mouth: Mucous membranes are moist.  Eyes:     Conjunctiva/sclera: Conjunctivae normal.  Cardiovascular:     Rate and Rhythm: Normal rate and regular rhythm.     Heart sounds: No murmur heard.   Pulmonary:     Effort: Pulmonary effort is normal. No respiratory distress.     Breath sounds: Normal breath sounds.  Abdominal:     Palpations: Abdomen is soft.     Tenderness: There is no abdominal tenderness.  Musculoskeletal:        General: Normal range of motion.     Cervical back: Neck supple.  Skin:    General: Skin is warm and dry.  Neurological:     General: No focal deficit present.     Mental Status: He is alert.  Psychiatric:        Mood and Affect: Mood normal.      UC Treatments / Results  Labs (all labs ordered are listed, but only abnormal results are displayed) Labs Reviewed  BASIC METABOLIC PANEL - Abnormal; Notable for the following components:      Result Value   Glucose, Bld 117 (*)    All other components within normal limits  CBC WITH DIFFERENTIAL/PLATELET    EKG   Radiology No results found.  Procedures Procedures (including critical care time)  Medications  Ordered in UC Medications - No data to display  Initial Impression / Assessment and Plan / UC Course  I have reviewed the triage vital signs and the nursing notes.  Pertinent labs & imaging results that were available during my care of the patient were reviewed by me and considered in my medical decision making (see chart for details).     MDM:  CBC and BMet no acute abnormality  Pt counseled on results.  Pt has 6 children.  Possible viral illness. Pt has had covid injections.  I doubt covid An After Visit Summary was printed and given to the patient.  Final Clinical Impressions(s) / UC Diagnoses   Final diagnoses:  Myalgia   Discharge Instructions   None    ED Prescriptions    Medication Sig Dispense Auth. Provider   diclofenac (VOLTAREN) 75 MG EC tablet Take 1 tablet (75 mg total) by mouth 2 (two) times daily. 20 tablet Elson Areas, [place]     PDMP not reviewed this encounter.   Elson Areas, [place] 12/25/19 1916

## 2019-12-25 NOTE — ED Notes (Signed)
Advised pt delay in receiving blood/lab results. Water/crackers/refreshments given. Pt verbalized understanding.

## 2020-01-09 ENCOUNTER — Ambulatory Visit: Payer: Medicaid Other | Admitting: Physician Assistant

## 2020-03-04 NOTE — Congregational Nurse Program (Signed)
Client came to [facility] with congregational nurse and to get assistance getting a pediatrician for his 6 children ranging between 54-41 years of age. [facility] contacted and he  will pick up new patient application package. I will assist him to complete the forms. Shiara Mcgough RN BSN PCCN [phone]-

## 2023-06-15 ENCOUNTER — Ambulatory Visit: Payer: Self-pay | Admitting: Nurse Practitioner

## 2023-06-17 ENCOUNTER — Ambulatory Visit (INDEPENDENT_AMBULATORY_CARE_PROVIDER_SITE_OTHER): Payer: BC Managed Care – PPO | Admitting: Nurse Practitioner

## 2023-06-17 ENCOUNTER — Encounter: Payer: Self-pay | Admitting: Nurse Practitioner

## 2023-06-17 VITALS — BP 137/85 | HR 72 | Temp 97.8°F | Resp 19 | Ht 68.5 in | Wt 183.4 lb

## 2023-06-17 DIAGNOSIS — N529 Male erectile dysfunction, unspecified: Secondary | ICD-10-CM | POA: Diagnosis not present

## 2023-06-17 DIAGNOSIS — Z0000 Encounter for general adult medical examination without abnormal findings: Secondary | ICD-10-CM | POA: Diagnosis not present

## 2023-06-17 HISTORY — DX: Male erectile dysfunction, unspecified: N52.9

## 2023-06-17 HISTORY — DX: Encounter for general adult medical examination without abnormal findings: Z00.00

## 2023-06-17 MED ORDER — SILDENAFIL CITRATE 50 MG PO TABS: 50.0000 mg | ORAL_TABLET | Freq: Every day | ORAL | 0 refills | Status: DC | PRN

## 2023-06-17 NOTE — Progress Notes (Signed)
Interpreter ID Cammy Copa [phone]

## 2023-06-17 NOTE — Assessment & Plan Note (Signed)
Annual exam as documented.  Counseling done include healthy lifestyle involving committing to 150 minutes of exercise per week, heart healthy diet, and attaining healthy weight. The importance of adequate sleep also discussed.  Regular use of seat belt and home safety were also discussed . Changes in health habits are decided on by patient with goals and time frames set for achieving them. Immunization and cancer screening  needs are specifically addressed at this visit.    1. Health care maintenance  - CBC - CMP14+EGFR - Hepatitis C antibody - HIV Antibody (routine testing w rflx) - Lipid panel

## 2023-06-17 NOTE — Assessment & Plan Note (Signed)
Start Viagra 50 mg 1 tablet daily as needed for erectile dysfunction, side effect of medication discussed encouraged to decrease dose of 25 mg 1 tablet daily as needed if he experiences any side effects - sildenafil (VIAGRA) 50 MG tablet; Take 1 tablet (50 mg total) by mouth daily as needed for erectile dysfunction.  Dispense: 10 tablet; Refill: 0 - Testosterone - PSA  Follow-up in 4 weeks

## 2023-06-17 NOTE — Patient Instructions (Signed)
    Erectile dysfunction, unspecified erectile dysfunction type  - sildenafil (VIAGRA) 50 MG tablet; Take 1 tablet (50 mg total) by mouth daily as needed for erectile dysfunction.  Dispense: 10 tablet; Refill: 0   50 mg once daily as needed 1 hour before sexual activity; may be taken up to 4 hours before sexual activity. Reduce to 25 mg once daily if side effects occur.    It is important that you exercise regularly at least 30 minutes 5 times a week as tolerated  Think about what you will eat, plan ahead. Choose " clean, green, fresh or frozen" over canned, processed or packaged foods which are more sugary, salty and fatty. 70 to 75% of food eaten should be vegetables and fruit. Three meals at set times with snacks allowed between meals, but they must be fruit or vegetables. Aim to eat over a 12 hour period , example 7 am to 7 pm, and STOP after  your last meal of the day. Drink water,generally about 64 ounces per day, no other drink is as healthy. Fruit juice is best enjoyed in a healthy way, by EATING the fruit.  Thanks for choosing [facility] we consider it a privelige to serve you.

## 2023-06-17 NOTE — Progress Notes (Signed)
Complete physical exam  Patient: William Carroll   DOB: [birth year]   42 y.o. Male  MRN: [id number]  Subjective:    Chief Complaint  Patient presents with   New Patient (Initial Visit)    William Carroll is a 42 y.o. male  with past medical history of varicose veins who presents today for a complete physical exam and to establish care  He reports consuming a general diet. The patient has a physically strenuous job, but has no regular exercise apart from work.  He generally feels well. He reports sleeping poorly due to taking care of his little children.   Erectile dysfunction.  Patient complains of erectile dysfunction, states that he is not happy about his condition , he is married with 8 children .no complaints of dysuria urinary hesitancy.     Patient declined flu vaccine, stated that he has had a Tdap vaccine in the past 10 years  Interpretation services provided via [facility] health iPad.  Most recent fall risk assessment:    05/24/2018    9:51 AM  Fall Risk   Falls in the past year? 0     Most recent depression screenings:    06/17/2023   10:08 AM 05/24/2018   10:01 AM  PHQ 2/9 Scores  PHQ - 2 Score 0   Exception Documentation  Patient refusal        Patient Care Team: Donell Beers, FNP as PCP - General (Nurse Practitioner)   Outpatient Medications Prior to Visit  Medication Sig   diclofenac (VOLTAREN) 75 MG EC tablet Take 1 tablet (75 mg total) by mouth 2 (two) times daily. (Patient not taking: Reported on 06/17/2023)   No facility-administered medications prior to visit.    Review of Systems  Constitutional:  Negative for appetite change, chills, fatigue and fever.  HENT:  Negative for congestion, postnasal drip, rhinorrhea and sneezing.   Eyes:  Negative for pain, discharge and itching.  Respiratory:  Negative for cough, shortness of breath and wheezing.   Cardiovascular:  Negative for chest pain, palpitations and leg swelling.   Gastrointestinal:  Negative for abdominal pain, constipation, nausea and vomiting.  Endocrine: Negative for cold intolerance, heat intolerance and polydipsia.  Genitourinary:  Negative for difficulty urinating, dysuria, flank pain, frequency, genital sores and hematuria.  Musculoskeletal:  Negative for arthralgias, back pain, joint swelling and myalgias.  Skin:  Negative for color change, pallor, rash and wound.  Allergic/Immunologic: Negative for environmental allergies, food allergies and immunocompromised state.  Neurological:  Negative for dizziness, facial asymmetry, weakness, numbness and headaches.  Psychiatric/Behavioral:  Negative for behavioral problems, confusion, self-injury and suicidal ideas.        Objective:     BP 137/85 (BP Location: Left Arm)   Pulse 72   Temp 97.8 F (36.6 C) (Oral)   Resp 19   Ht 5' 8.5" (1.74 m)   Wt 183 lb 6.4 oz (83.2 kg)   SpO2 99%   BMI 27.48 kg/m    Physical Exam Vitals and nursing note reviewed. Exam conducted with a chaperone present.  Constitutional:      General: He is not in acute distress.    Appearance: Normal appearance. He is not ill-appearing, toxic-appearing or diaphoretic.  HENT:     Right Ear: Tympanic membrane, ear canal and external ear normal. There is no impacted cerumen.     Left Ear: Tympanic membrane, ear canal and external ear normal. There is no impacted cerumen.     Nose: Nose normal.  No congestion or rhinorrhea.     Mouth/Throat:     Mouth: Mucous membranes are moist.     Pharynx: Oropharynx is clear. No oropharyngeal exudate or posterior oropharyngeal erythema.  Eyes:     General: No scleral icterus.       Right eye: No discharge.        Left eye: No discharge.     Extraocular Movements: Extraocular movements intact.     Conjunctiva/sclera: Conjunctivae normal.  Neck:     Vascular: No carotid bruit.  Cardiovascular:     Rate and Rhythm: Normal rate and regular rhythm.     Pulses: Normal pulses.      Heart sounds: Normal heart sounds. No murmur heard.    No friction rub. No gallop.  Pulmonary:     Effort: Pulmonary effort is normal. No respiratory distress.     Breath sounds: Normal breath sounds. No stridor. No wheezing, rhonchi or rales.  Chest:     Chest wall: No tenderness.  Abdominal:     General: Bowel sounds are normal. There is no distension.     Palpations: Abdomen is soft. There is no mass.     Tenderness: There is no abdominal tenderness. There is no right CVA tenderness, left CVA tenderness, guarding or rebound.     Hernia: No hernia is present.  Musculoskeletal:        General: No swelling, tenderness, deformity or signs of injury.     Cervical back: Normal range of motion and neck supple. No rigidity or tenderness.     Right lower leg: No edema.     Left lower leg: No edema.  Lymphadenopathy:     Cervical: No cervical adenopathy.  Skin:    General: Skin is warm and dry.     Capillary Refill: Capillary refill takes less than 2 seconds.     Coloration: Skin is not jaundiced or pale.     Findings: No bruising, erythema, lesion or rash.  Neurological:     Mental Status: He is alert and oriented to person, place, and time.     Cranial Nerves: No cranial nerve deficit.     Sensory: No sensory deficit.     Motor: No weakness.     Coordination: Coordination normal.     Gait: Gait normal.     Deep Tendon Reflexes: Reflexes normal.  Psychiatric:        Mood and Affect: Mood normal.        Behavior: Behavior normal.        Thought Content: Thought content normal.        Judgment: Judgment normal.     No results found for any visits on 06/17/23.     Assessment & Plan:    Routine Health Maintenance and Physical Exam   There is no immunization history on file for this patient.  Health Maintenance  Topic Date Due   HIV Screening  Never done   Hepatitis C Screening  Never done   DTaP/Tdap/Td (1 - Tdap) Never done   COVID-19 Vaccine (1 - 2024-25 season) Never  done   INFLUENZA VACCINE  10/03/2023 (Originally 02/03/2023)   HPV VACCINES  Aged Out    Discussed health benefits of physical activity, and encouraged him to engage in regular exercise appropriate for his age and condition.  Problem List Items Addressed This Visit       Other   Erectile dysfunction   Start Viagra 50 mg 1 tablet daily as needed for  erectile dysfunction, side effect of medication discussed encouraged to decrease dose of 25 mg 1 tablet daily as needed if he experiences any side effects - sildenafil (VIAGRA) 50 MG tablet; Take 1 tablet (50 mg total) by mouth daily as needed for erectile dysfunction.  Dispense: 10 tablet; Refill: 0 - Testosterone - PSA  Follow-up in 4 weeks      Relevant Medications   sildenafil (VIAGRA) 50 MG tablet   Other Relevant Orders   Testosterone   PSA   Annual physical exam - Primary   Annual exam as documented.  Counseling done include healthy lifestyle involving committing to 150 minutes of exercise per week, heart healthy diet, and attaining healthy weight. The importance of adequate sleep also discussed.  Regular use of seat belt and home safety were also discussed . Changes in health habits are decided on by patient with goals and time frames set for achieving them. Immunization and cancer screening  needs are specifically addressed at this visit.    1. Health care maintenance  - CBC - CMP14+EGFR - Hepatitis C antibody - HIV Antibody (routine testing w rflx) - Lipid panel        Other Visit Diagnoses       Health care maintenance       Relevant Orders   CBC   CMP14+EGFR   Hepatitis C antibody   HIV Antibody (routine testing w rflx)   Lipid panel      Return in about 4 weeks (around 07/15/2023) for erectile dysfunction.     Donell Beers, FNP

## 2023-06-18 LAB — CBC
Hematocrit: 47.5 % (ref 37.5–51.0)
Hemoglobin: 15.3 g/dL (ref 13.0–17.7)
MCH: 29.3 pg (ref 26.6–33.0)
MCHC: 32.2 g/dL (ref 31.5–35.7)
MCV: 91 fL (ref 79–97)
Platelets: 199 10*3/uL (ref 150–450)
RBC: 5.22 x10E6/uL (ref 4.14–5.80)
RDW: 13.2 % (ref 11.6–15.4)
WBC: 5.6 10*3/uL (ref 3.4–10.8)

## 2023-06-18 LAB — CMP14+EGFR
ALT: 25 [IU]/L (ref 0–44)
AST: 29 [IU]/L (ref 0–40)
Albumin: 4.7 g/dL (ref 4.1–5.1)
Alkaline Phosphatase: 76 [IU]/L (ref 44–121)
BUN/Creatinine Ratio: 18 (ref 9–20)
BUN: 22 mg/dL (ref 6–24)
Bilirubin Total: 0.2 mg/dL (ref 0.0–1.2)
CO2: 24 mmol/L (ref 20–29)
Calcium: 9.9 mg/dL (ref 8.7–10.2)
Chloride: 101 mmol/L (ref 96–106)
Creatinine, Ser: 1.19 mg/dL (ref 0.76–1.27)
Globulin, Total: 2.8 g/dL (ref 1.5–4.5)
Glucose: 98 mg/dL (ref 70–99)
Potassium: 3.9 mmol/L (ref 3.5–5.2)
Sodium: 139 mmol/L (ref 134–144)
Total Protein: 7.5 g/dL (ref 6.0–8.5)
eGFR: 77 mL/min/{1.73_m2} (ref >59)

## 2023-06-18 LAB — LIPID PANEL
Chol/HDL Ratio: 5.2 {ratio} — ABNORMAL HIGH (ref 0.0–5.0)
Cholesterol, Total: 212 mg/dL — ABNORMAL HIGH (ref 100–199)
HDL: 41 mg/dL (ref >39)
LDL Chol Calc (NIH): 147 mg/dL — ABNORMAL HIGH (ref 0–99)
Triglycerides: 131 mg/dL (ref 0–149)
VLDL Cholesterol Cal: 24 mg/dL (ref 5–40)

## 2023-06-18 LAB — HEPATITIS C ANTIBODY: Hep C Virus Ab: NONREACTIVE

## 2023-06-18 LAB — TESTOSTERONE: Testosterone: 489 ng/dL (ref 264–916)

## 2023-06-18 LAB — HIV ANTIBODY (ROUTINE TESTING W REFLEX): HIV Screen 4th Generation wRfx: NONREACTIVE

## 2023-06-18 LAB — PSA: Prostate Specific Ag, Serum: 0.6 ng/mL (ref 0.0–4.0)

## 2023-06-22 ENCOUNTER — Other Ambulatory Visit: Payer: Self-pay

## 2023-06-22 ENCOUNTER — Encounter (HOSPITAL_COMMUNITY): Payer: Self-pay | Admitting: *Deleted

## 2023-06-22 ENCOUNTER — Ambulatory Visit (HOSPITAL_COMMUNITY)
Admission: EM | Admit: 2023-06-22 | Discharge: 2023-06-22 | Disposition: A | Discharge status: Home / Self Care | Payer: Medicaid Other | Attending: Family Medicine | Admitting: Family Medicine

## 2023-06-22 DIAGNOSIS — J069 Acute upper respiratory infection, unspecified: Secondary | ICD-10-CM | POA: Diagnosis not present

## 2023-06-22 MED ORDER — HYDROCODONE BIT-HOMATROP MBR 5-1.5 MG/5ML PO SOLN: 5.0000 mL | Freq: Four times a day (QID) | ORAL | 0 refills | Status: AC | PRN

## 2023-06-22 NOTE — ED Triage Notes (Signed)
Pt reports he has had a runny nose,HA,cough for 2 days.

## 2023-06-22 NOTE — Discharge Instructions (Signed)
Be aware, your cough medication may cause drowsiness. Please do not drive, operate heavy machinery or make important decisions while on this medication, it can cloud your judgement.  

## 2023-06-23 NOTE — ED Provider Notes (Signed)
  [facility]   [id number] 06/22/23 Arrival Time: 1631  ASSESSMENT & PLAN:  1. Viral URI with cough    Discussed typical duration of likely viral illness. Work note provided. OTC symptom care as needed.  Discharge Medication List as of 06/22/2023  7:12 PM     START taking these medications   Details  HYDROcodone bit-homatropine (HYCODAN) 5-1.5 MG/5ML syrup Take 5 mLs by mouth every 6 (six) hours as needed for cough., Starting Wed 06/22/2023, Normal         Follow-up Information     Paseda, Baird Kay, FNP.   Specialty: Nurse Practitioner Why: As needed. Contact information: [address] [place] [address] [phone]                 Reviewed expectations re: course of current medical issues. Questions answered. Outlined signs and symptoms indicating need for more acute intervention. Understanding verbalized. After Visit Summary given.   SUBJECTIVE: History from: Patient. Kiswahili interpreter used. William Carroll is a 43 y.o. male. Pt reports he has had a runny nose,HA,cough for 2 days. Denies: fever. Normal PO intake without n/v/d. Very fatigued.  OBJECTIVE:  Vitals:   06/22/23 1757  BP: 130/87  Pulse: 82  Resp: 20  Temp: 98.1 F (36.7 C)  SpO2: 95%    General appearance: alert; no distress Eyes: PERRLA; EOMI; conjunctiva normal HENT: [place]; AT; with nasal congestion Neck: supple  Lungs: speaks full sentences without difficulty; unlabored; dry cough Extremities: no edema Skin: warm and dry Neurologic: normal gait Psychological: alert and cooperative; normal mood and affect    No Known Allergies  Past Medical History:  Diagnosis Date   Annual physical exam 06/17/2023   Varicose veins of lower extremity    Social History   Socioeconomic History   Marital status: Married    Spouse name: Not on file   Number of children: 8   Years of education: Not on file   Highest education level: Not on file   Occupational History   Not on file  Tobacco Use   Smoking status: Never   Smokeless tobacco: Never  Vaping Use   Vaping status: Never Used  Substance and Sexual Activity   Alcohol use: Not Currently   Drug use: Never   Sexual activity: Yes  Other Topics Concern   Not on file  Social History Narrative   Lives with his wife.    Social Drivers of Corporate investment banker Strain: Not on file  Food Insecurity: Not on file  Transportation Needs: Not on file  Physical Activity: Not on file  Stress: Not on file  Social Connections: Not on file  Intimate Partner Violence: Not on file   Family History  Problem Relation Age of Onset   Diabetes Neg Hx    Hypertension Neg Hx    CAD Neg Hx    CVA Neg Hx    Cancer Neg Hx    Past Surgical History:  Procedure Laterality Date   ENDOVENOUS ABLATION SAPHENOUS VEIN W/ LASER Left 03/14/2019   endovenous laser ablation left small saphenous vein and stab phlebectomy 10-20 incisions left leg by Fabienne Bruns MD    NO PAST SURGERIES       Mardella Layman, MD 06/23/23 [phone]

## 2023-07-14 DIAGNOSIS — Z23 Encounter for immunization: Secondary | ICD-10-CM | POA: Diagnosis not present

## 2023-07-15 ENCOUNTER — Telehealth: Payer: Medicaid Other | Admitting: Nurse Practitioner

## 2023-07-15 ENCOUNTER — Encounter: Payer: Self-pay | Admitting: Nurse Practitioner

## 2023-07-15 VITALS — Ht 68.0 in | Wt 183.0 lb

## 2023-07-15 DIAGNOSIS — N529 Male erectile dysfunction, unspecified: Secondary | ICD-10-CM

## 2023-07-15 DIAGNOSIS — E785 Hyperlipidemia, unspecified: Secondary | ICD-10-CM | POA: Diagnosis not present

## 2023-07-15 HISTORY — DX: Hyperlipidemia, unspecified: E78.5

## 2023-07-15 MED ORDER — SILDENAFIL CITRATE 50 MG PO TABS: 50.0000 mg | ORAL_TABLET | Freq: Every day | ORAL | 0 refills | Status: AC | PRN

## 2023-07-15 NOTE — Assessment & Plan Note (Signed)
 Avoid fatty fried foods  Lab Results  Component Value Date   CHOL 212 (H) 06/17/2023   HDL 41 06/17/2023   LDLCALC 147 (H) 06/17/2023   TRIG 131 06/17/2023   CHOLHDL 5.2 (H) 06/17/2023

## 2023-07-15 NOTE — Assessment & Plan Note (Signed)
 Medication resent to the pharmacy How to take medication reviewed with the patient Follow-up in the office in 4 weeks - sildenafil  (VIAGRA ) 50 MG tablet; Take 1 tablet (50 mg total) by mouth daily as needed for erectile dysfunction.  Dispense: 10 tablet; Refill: 0

## 2023-07-15 NOTE — Progress Notes (Signed)
 Virtual Visit via Telephone Note  I connected with William Carroll @ on 07/15/23 at  2:00 PM EST by telephone and verified that I am speaking with the correct person using two identifiers.  I spent 10 minutes on this telehealth encounter.  Due to using an interpreter unable to connect with the patient on video.  Medical interpreter ID number [id number] NEAETOR NESEOR  Location: Patient: home Provider: home   I discussed the limitations, risks, security and privacy concerns of performing an evaluation and management service by telephone and the availability of in person appointments. I also discussed with the patient that there may be a patient responsible charge related to this service. The patient expressed understanding and agreed to proceed.   History of Present Illness: William Carroll  has a past medical history of Annual physical exam (06/17/2023), Erectile dysfunction (06/17/2023), Hyperlipidemia (07/15/2023), and Varicose veins of lower extremity. Patient presents for follow-up for erectile dysfunction.  He was prescribed sildenafil  50 mg daily as needed at his last visit but stated that he went to the pharmacy and was not given the medication.  He is still interested in starting medication for ED. Labs done at last visit were normal except for hyperlipidemia.    Observations/Objective:   Assessment and Plan: Hyperlipidemia Avoid fatty fried foods  Lab Results  Component Value Date   CHOL 212 (H) 06/17/2023   HDL 41 06/17/2023   LDLCALC 147 (H) 06/17/2023   TRIG 131 06/17/2023   CHOLHDL 5.2 (H) 06/17/2023     Erectile dysfunction Medication resent to the pharmacy How to take medication reviewed with the patient Follow-up in the office in 4 weeks - sildenafil  (VIAGRA ) 50 MG tablet; Take 1 tablet (50 mg total) by mouth daily as needed for erectile dysfunction.  Dispense: 10 tablet; Refill: 0    Follow Up Instructions:    I discussed the assessment and treatment plan with  the patient. The patient was provided an opportunity to ask questions and all were answered. The patient agreed with the plan and demonstrated an understanding of the instructions.   The patient was advised to call back or seek an in-person evaluation if the symptoms worsen or if the condition fails to improve as anticipated.

## 2023-07-15 NOTE — Patient Instructions (Addendum)
 1. Hyperlipidemia, unspecified hyperlipidemia type (Primary)   2. Erectile dysfunction, unspecified erectile dysfunction type  - sildenafil  (VIAGRA ) 50 MG tablet; Take 1 tablet (50 mg total) by mouth daily as needed for erectile dysfunction.  Dispense: 10 tablet; Refill: 0   50 mg once daily as needed 1 hour before sexual activity; may be taken up to 4 hours before sexual activity. Reduce to 25 mg once daily if side effects occur.   It is important that you exercise regularly at least 30 minutes 5 times a week as tolerated  Think about what you will eat, plan ahead. Choose  clean, green, fresh or frozen over canned, processed or packaged foods which are more sugary, salty and fatty. 70 to 75% of food eaten should be vegetables and fruit. Three meals at set times with snacks allowed between meals, but they must be fruit or vegetables. Aim to eat over a 12 hour period , example 7 am to 7 pm, and STOP after  your last meal of the day. Drink water,generally about 64 ounces per day, no other drink is as healthy. Fruit juice is best enjoyed in a healthy way, by EATING the fruit.  Thanks for choosing [facility] we consider it a privelige to serve you.

## 2023-08-12 ENCOUNTER — Ambulatory Visit: Payer: Self-pay | Admitting: Nurse Practitioner

## 2024-07-03 ENCOUNTER — Ambulatory Visit (HOSPITAL_COMMUNITY)
Admission: EM | Admit: 2024-07-03 | Discharge: 2024-07-03 | Disposition: A | Discharge status: Home / Self Care | Attending: Internal Medicine | Admitting: Internal Medicine

## 2024-07-03 ENCOUNTER — Encounter (HOSPITAL_COMMUNITY): Payer: Self-pay

## 2024-07-03 DIAGNOSIS — J069 Acute upper respiratory infection, unspecified: Secondary | ICD-10-CM

## 2024-07-03 DIAGNOSIS — J101 Influenza due to other identified influenza virus with other respiratory manifestations: Secondary | ICD-10-CM

## 2024-07-03 LAB — POCT INFLUENZA A/B
Influenza A, POC: POSITIVE — AB
Influenza B, POC: NEGATIVE

## 2024-07-03 LAB — POC SOFIA SARS ANTIGEN FIA: SARS Coronavirus 2 Ag: NEGATIVE

## 2024-07-03 MED ORDER — ACETAMINOPHEN 500 MG PO TABS: 1000.0000 mg | ORAL_TABLET | Freq: Four times a day (QID) | ORAL | 0 refills | Status: AC | PRN

## 2024-07-03 MED ORDER — GUAIFENESIN ER 1200 MG PO TB12: 1200.0000 mg | ORAL_TABLET | Freq: Two times a day (BID) | ORAL | 0 refills | Status: AC

## 2024-07-03 MED ORDER — BENZONATATE 100 MG PO CAPS: 100.0000 mg | ORAL_CAPSULE | Freq: Three times a day (TID) | ORAL | 0 refills | Status: AC

## 2024-07-03 NOTE — ED Triage Notes (Addendum)
 Per Interpreter [phone]  Patient c/o fever, nasal congestion, and a non productive cough x 4 days.  Patient denies taking any medications for his symptoms.  Patient added that he was having right ear pain also.

## 2024-07-03 NOTE — Discharge Instructions (Signed)
 You have the flu. Tylenol  1,000mg  every 6 hours as needed for fever and bodyaches. Mucinex every 12 hours as needed for nasal congestion. Tessalon Perles every 8 hours as needed for coughing.  If you develop any new or worsening symptoms or if your symptoms do not start to improve, please return here or follow-up with your primary care provider. If your symptoms are severe, please go to the emergency room.

## 2024-07-03 NOTE — ED Provider Notes (Signed)
 " [facility]    CSN: [id number] Arrival date & time: 07/03/24  0813      History   Chief Complaint Chief Complaint  Patient presents with   Cough   Fever   Otalgia   Nasal Congestion    HPI William Carroll is a 43 y.o. male.   William Carroll is a 44 y.o. male presenting for chief complaint of Cough, Fever, Otalgia, and Nasal Congestion that started 4 to 5 days ago. Cough is productive with a small amount of mucous. Denies nausea, vomiting, diarrhea, abdominal pain, rashes, shortness of breath, chest pain, heart palpitations, and dizziness.  He reports right ear pain without tinnitus or decreased hearing from the right ear.  States ears feel full.  Denies history of chronic respiratory problems.  He denies recent sick contacts with similar symptoms. He has not taken any over the counter medicines to help with symptoms prior to arrival.   The history is provided by the patient. The history is limited by a language barrier. A language interpreter was used [employer] interpreter used for entirety of patient encounter).  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 Authorizing Provider  acetaminophen  (TYLENOL ) 500 MG tablet Take 2 tablets (1,000 mg total) by mouth every 6 (six) hours as needed. 07/03/24  Yes Enedelia Dorna HERO, FNP  benzonatate (TESSALON) 100 MG capsule Take 1 capsule (100 mg total) by mouth every 8 (eight) hours. 07/03/24  Yes Enedelia Dorna HERO, FNP  Guaifenesin 1200 MG TB12 Take 1 tablet (1,200 mg total) by mouth in the morning and at bedtime. 07/03/24  Yes Enedelia Dorna HERO, FNP  HYDROcodone  bit-homatropine (HYCODAN) 5-1.5 MG/5ML syrup Take 5 mLs by mouth every 6 (six) hours as needed for cough. Patient not taking: Reported on 07/15/2023 06/22/23   Rolinda Rogue, MD  sildenafil  (VIAGRA ) 50 MG tablet Take 1 tablet (50 mg total) by mouth daily as needed for erectile dysfunction. 07/15/23   Paseda, Folashade R, FNP    Family History Family History  Problem Relation Age of Onset   Diabetes Neg Hx    Hypertension Neg Hx    CAD Neg Hx    CVA Neg Hx    Cancer Neg Hx     Social History Social History[1]   Allergies   Patient has no known allergies.   Review of Systems Review of Systems  Constitutional:  Positive for fever.  HENT:  Positive for ear pain.   Respiratory:  Positive for cough.   Per HPI   Physical Exam Triage Vital Signs ED Triage Vitals  Encounter  Vitals Group     BP 07/03/24 0835 136/81     Girls Systolic BP Percentile --      Girls Diastolic BP Percentile --      Boys Systolic BP Percentile --      Boys Diastolic BP Percentile --      Pulse Rate 07/03/24 0835 (!) 101     Resp 07/03/24 0835 16     Temp 07/03/24 0835 100.3 F (37.9 C)     Temp Source 07/03/24 0835 Oral     SpO2 07/03/24 0835 94 %     Weight --      Height --      Head Circumference --      Peak Flow --      Pain Score 07/03/24 0839 0     Pain Loc --      Pain Education --      Exclude from Growth Chart --    No data found.  Updated Vital Signs BP 136/81 (BP Location: Left  Arm)   Pulse (!) 101   Temp 100.3 F (37.9 C) (Oral)   Resp 16   SpO2 94%   Visual Acuity Right Eye Distance:   Left Eye Distance:   Bilateral Distance:    Right Eye Near:   Left Eye Near:    Bilateral Near:     Physical Exam Vitals and nursing note reviewed.  Constitutional:      Appearance: He is not ill-appearing or toxic-appearing.  HENT:     Head: Normocephalic and atraumatic.     Right Ear: Hearing, tympanic membrane, ear canal and external ear normal.     Left Ear: Hearing, tympanic membrane, ear canal and external ear normal.     Nose: Congestion present.     Mouth/Throat:     Lips: Pink.     Mouth: Mucous membranes are moist. No injury or oral lesions.     Dentition: Normal dentition.     Tongue: No lesions.     Pharynx: Oropharynx is clear. Uvula midline. No pharyngeal swelling, oropharyngeal exudate, posterior oropharyngeal erythema, uvula swelling or postnasal drip.     Tonsils: No tonsillar exudate.  Eyes:     General: Lids are normal. Vision grossly intact. Gaze aligned appropriately.     Extraocular Movements: Extraocular movements intact.     Conjunctiva/sclera: Conjunctivae normal.  Neck:     Trachea: Trachea and phonation normal.  Cardiovascular:     Rate and Rhythm: Normal rate and regular rhythm.     Heart sounds: Normal heart sounds, S1 normal and S2 normal.  Pulmonary:     Effort: Pulmonary effort is normal. No respiratory distress.     Breath sounds: Normal breath sounds and air entry. No wheezing, rhonchi or rales.  Chest:     Chest wall: No tenderness.  Musculoskeletal:     Cervical back: Neck supple.  Lymphadenopathy:     Cervical: No cervical adenopathy.  Skin:    General: Skin is warm and dry.     Capillary Refill: Capillary refill takes less than 2 seconds.     Findings: No rash.  Neurological:     General: No focal deficit present.     Mental Status: He is alert and oriented to person, place, and time. Mental status is at baseline.      Cranial Nerves: No dysarthria or facial asymmetry.  Psychiatric:        Mood and Affect: Mood normal.  Speech: Speech normal.        Behavior: Behavior normal.        Thought Content: Thought content normal.        Judgment: Judgment normal.      UC Treatments / Results  Labs (all labs ordered are listed, but only abnormal results are displayed) Labs Reviewed  POCT INFLUENZA A/B - Abnormal; Notable for the following components:      Result Value   Influenza A, POC Positive (*)    All other components within normal limits  POC SOFIA SARS ANTIGEN FIA    EKG   Radiology No results found.  Procedures Procedures (including critical care time)  Medications Ordered in UC Medications - No data to display  Initial Impression / Assessment and Plan / UC Course  I have reviewed the triage vital signs and the nursing notes.  Pertinent labs & imaging results that were available during my care of the patient were reviewed by me and considered in my medical decision making (see chart for details).   1. Influenza A, viral URI with cough Flu A point of care testing positive ordered by nursing staff, COVID testing negative. Lungs clear, vitals hemodynamically stable, therefore deferred imaging. Not eligible for Tamiflu  given timing of illness.  Recommend supportive care for further symptomatic relief as outlined in AVS.  Modes of transmission, quarantine recommendations, and hand hygiene discussed.   Counseled patient on potential for adverse effects with medications prescribed/recommended today, strict ER and return-to-clinic precautions discussed, patient verbalized understanding.    Final Clinical Impressions(s) / UC Diagnoses   Final diagnoses:  Influenza A  Viral URI with cough     Discharge Instructions      You have the flu. Tylenol  1,000mg  every 6 hours as needed for fever and bodyaches. Mucinex every 12 hours as needed for nasal congestion. Tessalon  Perles every 8 hours as needed for coughing.  If you develop any new or worsening symptoms or if your symptoms do not start to improve, please return here or follow-up with your primary care provider. If your symptoms are severe, please go to the emergency room.      ED Prescriptions     Medication Sig Dispense Auth. Provider   acetaminophen  (TYLENOL ) 500 MG tablet Take 2 tablets (1,000 mg total) by mouth every 6 (six) hours as needed. 30 tablet Kieu Quiggle M, FNP   Guaifenesin 1200 MG TB12 Take 1 tablet (1,200 mg total) by mouth in the morning and at bedtime. 14 tablet Enedelia Going M, FNP   benzonatate (TESSALON) 100 MG capsule Take 1 capsule (100 mg total) by mouth every 8 (eight) hours. 21 capsule Enedelia Going HERO, FNP      PDMP not reviewed this encounter.     [1]  Social History Tobacco Use   Smoking status: Never   Smokeless tobacco: Never  Vaping Use   Vaping status: Never Used  Substance Use Topics   Alcohol use: Not Currently   Drug use: Never     Enedelia Going HERO, FNP 07/03/24 [phone]  "
